# Patient Record
Sex: Female | Born: 1965
Health system: Southern US, Community
[De-identification: ages and names within clinical notes are randomized; demographics above are authoritative.]

## PROBLEM LIST (undated history)

## (undated) DIAGNOSIS — N6002 Solitary cyst of left breast: Secondary | ICD-10-CM

## (undated) DIAGNOSIS — A6000 Herpesviral infection of urogenital system, unspecified: Secondary | ICD-10-CM

## (undated) HISTORY — DX: Solitary cyst of left breast: N60.02

## (undated) HISTORY — PX: OTHER SURGICAL HISTORY: SHX169

## (undated) HISTORY — DX: Herpesviral infection of urogenital system, unspecified: A60.00

---

## 2006-01-17 HISTORY — PX: OTHER SURGICAL HISTORY: SHX169

## 2006-03-03 ENCOUNTER — Ambulatory Visit: Payer: Self-pay | Admitting: Internal Medicine

## 2007-08-17 ENCOUNTER — Ambulatory Visit: Payer: Self-pay | Admitting: Internal Medicine

## 2007-08-17 DIAGNOSIS — R42 Dizziness and giddiness: Secondary | ICD-10-CM | POA: Insufficient documentation

## 2007-11-15 ENCOUNTER — Ambulatory Visit: Payer: Self-pay | Admitting: Internal Medicine

## 2008-06-10 ENCOUNTER — Encounter (INDEPENDENT_AMBULATORY_CARE_PROVIDER_SITE_OTHER): Payer: Self-pay | Admitting: Internal Medicine

## 2008-06-10 ENCOUNTER — Ambulatory Visit: Payer: Self-pay | Admitting: Family Medicine

## 2008-06-10 DIAGNOSIS — R079 Chest pain, unspecified: Secondary | ICD-10-CM | POA: Insufficient documentation

## 2011-01-24 ENCOUNTER — Encounter: Payer: Self-pay | Admitting: Family Medicine

## 2011-01-24 ENCOUNTER — Ambulatory Visit (INDEPENDENT_AMBULATORY_CARE_PROVIDER_SITE_OTHER): Payer: Commercial Indemnity | Admitting: Family Medicine

## 2011-01-24 VITALS — BP 104/74 | HR 96 | Temp 98.6°F | Wt 172.0 lb

## 2011-01-24 DIAGNOSIS — J029 Acute pharyngitis, unspecified: Secondary | ICD-10-CM

## 2011-01-24 LAB — POCT RAPID STREP A (OFFICE): Rapid Strep A Screen: NEGATIVE

## 2011-01-24 MED ORDER — AMOXICILLIN 875 MG PO TABS: 875.0000 mg | ORAL_TABLET | Freq: Two times a day (BID) | ORAL | Status: AC

## 2011-01-24 NOTE — Progress Notes (Signed)
duration of symptoms: 1 day Rhinorrhea: yes congestion:yes ear pain: no sore throat: yes Cough: no Myalgias:yes, diffuse other concerns: no known fever but she feels hot.  Had chills last night and sweats, too.   Taking tylenol and zycam, hot tea.   No known sick contacts  ROS: See HPI.  Otherwise negative.    Meds, vitals, and allergies reviewed.   GEN: nad, alert and oriented HEENT: mucous membranes moist, TM w/o erythema, nasal epithelium injected, OP with cobblestoning NECK: supple w/ tender LA CV: rrr. PULM: ctab, no inc wob ABD: soft, +bs EXT: no edema  RST neg.

## 2011-01-24 NOTE — Patient Instructions (Signed)
Drink plenty of fluids, take tylenol as needed, and gargle with warm salt water for your throat.  This should gradually improve.  Take care.  Let us know if you have other concerns.   Start the amoxil today.  

## 2011-01-25 DIAGNOSIS — J029 Acute pharyngitis, unspecified: Secondary | ICD-10-CM | POA: Insufficient documentation

## 2011-01-25 NOTE — Assessment & Plan Note (Signed)
She has 4/5 characteristics of strep (no exudates).  Presumed strep, treat.  ddx d/w, supportive tx o/w.  She agrees.

## 2011-04-22 ENCOUNTER — Telehealth: Payer: Self-pay

## 2011-05-21 ENCOUNTER — Ambulatory Visit: Payer: Self-pay | Admitting: Orthopedic Surgery

## 2014-06-29 ENCOUNTER — Encounter: Payer: Self-pay | Admitting: *Deleted

## 2014-06-29 DIAGNOSIS — Y998 Other external cause status: Secondary | ICD-10-CM | POA: Diagnosis not present

## 2014-06-29 DIAGNOSIS — W5501XA Bitten by cat, initial encounter: Secondary | ICD-10-CM | POA: Insufficient documentation

## 2014-06-29 DIAGNOSIS — Y9389 Activity, other specified: Secondary | ICD-10-CM | POA: Diagnosis not present

## 2014-06-29 DIAGNOSIS — S61451A Open bite of right hand, initial encounter: Secondary | ICD-10-CM | POA: Diagnosis present

## 2014-06-29 DIAGNOSIS — S61011A Laceration without foreign body of right thumb without damage to nail, initial encounter: Secondary | ICD-10-CM | POA: Insufficient documentation

## 2014-06-29 DIAGNOSIS — Y9289 Other specified places as the place of occurrence of the external cause: Secondary | ICD-10-CM | POA: Diagnosis not present

## 2014-06-29 NOTE — ED Notes (Signed)
Pt has catbite to right hand.   States her 2 cats were fighting and broke up the cat fight.  Pt has puncture wounds to right thumb.

## 2014-06-30 ENCOUNTER — Emergency Department
Admission: EM | Admit: 2014-06-30 | Discharge: 2014-06-30 | Disposition: A | Discharge status: Home / Self Care | Payer: BLUE CROSS/BLUE SHIELD | Attending: Emergency Medicine | Admitting: Emergency Medicine

## 2014-06-30 DIAGNOSIS — W5501XA Bitten by cat, initial encounter: Secondary | ICD-10-CM

## 2014-06-30 DIAGNOSIS — S61451A Open bite of right hand, initial encounter: Secondary | ICD-10-CM

## 2014-06-30 MED ORDER — IBUPROFEN 600 MG PO TABS: 600.0000 mg | ORAL_TABLET | Freq: Three times a day (TID) | ORAL | Status: DC | PRN

## 2014-06-30 MED ORDER — AMOXICILLIN-POT CLAVULANATE 875-125 MG PO TABS
1.0000 | ORAL_TABLET | Freq: Once | ORAL | Status: AC
  Administered 2014-06-30: 1 via ORAL

## 2014-06-30 MED ORDER — BACITRACIN ZINC 500 UNIT/GM EX OINT
TOPICAL_OINTMENT | CUTANEOUS | Status: AC
  Administered 2014-06-30: 1 via TOPICAL
  Filled 2014-06-30: qty 0.9

## 2014-06-30 MED ORDER — IBUPROFEN 600 MG PO TABS
600.0000 mg | ORAL_TABLET | Freq: Once | ORAL | Status: AC
  Administered 2014-06-30: 600 mg via ORAL

## 2014-06-30 MED ORDER — HYDROCODONE-ACETAMINOPHEN 5-325 MG PO TABS: 1.0000 | ORAL_TABLET | Freq: Four times a day (QID) | ORAL | Status: DC | PRN

## 2014-06-30 MED ORDER — AMOXICILLIN-POT CLAVULANATE 875-125 MG PO TABS
ORAL_TABLET | ORAL | Status: AC
Start: 2014-06-30 — End: 2014-06-30
  Administered 2014-06-30: 1 via ORAL
  Filled 2014-06-30: qty 1

## 2014-06-30 MED ORDER — BACITRACIN 500 UNIT/GM EX OINT
1.0000 "application " | TOPICAL_OINTMENT | Freq: Two times a day (BID) | CUTANEOUS | Status: DC
  Administered 2014-06-30: 1 via TOPICAL

## 2014-06-30 MED ORDER — HYDROCODONE-ACETAMINOPHEN 5-325 MG PO TABS
ORAL_TABLET | ORAL | Status: AC
  Administered 2014-06-30: 1 via ORAL
  Filled 2014-06-30: qty 1

## 2014-06-30 MED ORDER — AMOXICILLIN-POT CLAVULANATE 875-125 MG PO TABS: 1.0000 | ORAL_TABLET | Freq: Two times a day (BID) | ORAL | Status: DC

## 2014-06-30 MED ORDER — IBUPROFEN 600 MG PO TABS
ORAL_TABLET | ORAL | Status: AC
Start: 2014-06-30 — End: 2014-06-30
  Administered 2014-06-30: 600 mg via ORAL
  Filled 2014-06-30: qty 1

## 2014-06-30 MED ORDER — HYDROCODONE-ACETAMINOPHEN 5-325 MG PO TABS
1.0000 | ORAL_TABLET | Freq: Once | ORAL | Status: AC
Start: 2014-06-30 — End: 2014-06-30
  Administered 2014-06-30: 1 via ORAL

## 2014-06-30 NOTE — ED Provider Notes (Signed)
[facility] Emergency Department Provider Note  ____________________________________________  Time seen: Approximately 3:30 AM  I have reviewed the triage vital signs and the nursing notes.   HISTORY  Chief Complaint Animal Bite    HPI Mercedes Daniels is a 49 y.o. female who presents with cat bite to right hand. States her 2 cats were fighting and she broke up the fight. Patient is right-hand dominant. Presents with puncture wounds to right thumb base. Complains of 10/10 pain. Denies heavy bleeding, numbness, tingling, foreign body sensation in wound.Both animals' vaccinations are up-to-date.   History reviewed. No pertinent past medical history. Patient is not a diabetic  Patient Active Problem List   Diagnosis Date Noted  . Pharyngitis 01/25/2011  . CHEST PAIN 06/10/2008  . VERTIGO 08/17/2007   Tetanus is up-to-date  Past Surgical History  Procedure Laterality Date  . Cyst under tongue      As a child    No current outpatient prescriptions on file.  Allergies Review of patient's allergies indicates no known allergies.  Family History  Problem Relation Age of Onset  . Cancer Father     Prostate  . Cancer Maternal Grandmother     Breast  . Hypertension Neg Hx   . Heart disease Neg Hx   . Diabetes Neg Hx     Social History History  Substance Use Topics  . Smoking status: Never Smoker   . Smokeless tobacco: Not on file  . Alcohol Use: Yes    Review of Systems Constitutional: No fever/chills Eyes: No visual changes. ENT: No sore throat. Cardiovascular: Denies chest pain. Respiratory: Denies shortness of breath. Gastrointestinal: No abdominal pain.  No nausea, no vomiting.  No diarrhea.  No constipation. Genitourinary: Negative for dysuria. Musculoskeletal: Positive for right hand pain. Negative for back pain. Skin: Negative for rash. Neurological: Negative for headaches, focal weakness or numbness.  10-point ROS otherwise  negative.  ____________________________________________   PHYSICAL EXAM:  VITAL SIGNS: ED Triage Vitals  Enc Vitals Group     BP 06/29/14 2300 129/79 mmHg     Pulse Rate 06/29/14 2258 76     Resp 06/29/14 2258 22     Temp 06/29/14 2258 98.6 F (37 C)     Temp Source 06/29/14 2258 Oral     SpO2 06/29/14 2258 100 %     Weight 06/29/14 2258 145 lb (65.772 kg)     Height 06/29/14 2258  (1.626 m)     Head Cir --      Peak Flow --      Pain Score 06/29/14 2259 8     Pain Loc --      Pain Edu? --      Excl. in GC? --     Constitutional: Alert and oriented. Anxious, in mild acute distress Eyes: Conjunctivae are normal. PERRL. EOMI. Head: Atraumatic. Nose: No congestion/rhinnorhea. Mouth/Throat: Mucous membranes are moist.  Oropharynx non-erythematous. Neck: No stridor.   Cardiovascular: Normal rate, regular rhythm. Grossly normal heart sounds.  Good peripheral circulation. Respiratory: Normal respiratory effort.  No retractions. Lungs CTAB. Gastrointestinal: Soft and nontender. No distention. No abdominal bruits. No CVA tenderness. Musculoskeletal: Right hand: Linear laceration to dorsal aspect of right thumb which is approximately 2 x 0.5 cm without active bleeding or foreign body visualized. Additional tiny puncture marks to dorsal part of right thumb and thenar eminence. 2+ radial pulses. Brisk less than 5 seconds cap refill. No lower extremity tenderness nor edema.  No joint effusions.  Neurologic:  Normal speech and language. No gross focal neurologic deficits are appreciated. Speech is normal. No gait instability. Skin:  Skin is warm, dry and intact. No rash noted. Psychiatric: Mood and affect are normal. Speech and behavior are normal.  ____________________________________________   LABS (all labs ordered are listed, but only abnormal results are displayed)  Labs Reviewed - No data to  display ____________________________________________  EKG  None ____________________________________________  RADIOLOGY  None ____________________________________________   PROCEDURES  Procedure(s) performed: None  Critical Care performed: No  ____________________________________________   INITIAL IMPRESSION / ASSESSMENT AND PLAN / ED COURSE  Pertinent labs & imaging results that were available during my care of the patient were reviewed by me and considered in my medical decision making (see chart for details).  49 year old female who presents with cat bite to right hand. Will irrigate and cleanse wound, administer analgesics, start patient on Augmentin with close orthopedic follow-up. Strict return precautions given. Patient and spouse verbalized understanding and agree with plan of care. ____________________________________________   FINAL CLINICAL IMPRESSION(S) / ED DIAGNOSES  Final diagnoses:  Cat bite of hand, right, initial encounter      Irean Hong, MD 06/30/14 [phone]

## 2014-06-30 NOTE — ED Notes (Signed)
Pt. Has 2cm long by 0.5 cm wide open wound to dorsal part of rt. Thumb.  Pt. Has smaller puncture wounds to inner part of rt. Thumb and hand.  Pt. Has swelling to inside of rt. Hand.

## 2014-06-30 NOTE — Discharge Instructions (Signed)
1. Take antibiotics as prescribed (Augmentin 875 mg twice daily 7 days). 2. Keep wound clean and dry. 3. Return to the ER for worsening symptoms, increased swelling, redness, red streaks up your arm, fever, vomiting or other concerns.  Animal Bite An animal bite can result in a scratch on the skin, deep open cut, puncture of the skin, crush injury, or tearing away of the skin or a body part. Dogs are responsible for most animal bites. Children are bitten more often than adults. An animal bite can range from very mild to more serious. A small bite from your house pet is no cause for alarm. However, some animal bites can become infected or injure a bone or other tissue. You must seek medical care if:  The skin is broken and bleeding does not slow down or stop after 15 minutes.  The puncture is deep and difficult to clean (such as a cat bite).  Pain, warmth, redness, or pus develops around the wound.  The bite is from a stray animal or rodent. There may be a risk of rabies infection.  The bite is from a snake, raccoon, skunk, fox, coyote, or bat. There may be a risk of rabies infection.  The person bitten has a chronic illness such as diabetes, liver disease, or cancer, or the person takes medicine that lowers the immune system.  There is concern about the location and severity of the bite. It is important to clean and protect an animal bite wound right away to prevent infection. Follow these steps:  Clean the wound with plenty of water and soap.  Apply an antibiotic cream.  Apply gentle pressure over the wound with a clean towel or gauze to slow or stop bleeding.  Elevate the affected area above the heart to help stop any bleeding.  Seek medical care. Getting medical care within 8 hours of the animal bite leads to the best possible outcome. DIAGNOSIS  Your caregiver will most likely:  Take a detailed history of the animal and the bite injury.  Perform a wound exam.  Take your  medical history. Blood tests or X-rays may be performed. Sometimes, infected bite wounds are cultured and sent to a lab to identify the infectious bacteria.  TREATMENT  Medical treatment will depend on the location and type of animal bite as well as the patient's medical history. Treatment may include:  Wound care, such as cleaning and flushing the wound with saline solution, bandaging, and elevating the affected area.  Antibiotics.  Tetanus immunization.  Rabies immunization.  Leaving the wound open to heal. This is often done with animal bites, due to the high risk of infection. However, in certain cases, wound closure with stitches, wound adhesive, skin adhesive strips, or staples may be used. Infected bites that are left untreated may require intravenous (IV) antibiotics and surgical treatment in the hospital. HOME CARE INSTRUCTIONS  Follow your caregiver's instructions for wound care.  Take all medicines as directed.  If your caregiver prescribes antibiotics, take them as directed. Finish them even if you start to feel better.  Follow up with your caregiver for further exams or immunizations as directed. You may need a tetanus shot if:  You cannot remember when you had your last tetanus shot.  You have never had a tetanus shot.  The injury broke your skin. If you get a tetanus shot, your arm may swell, get red, and feel warm to the touch. This is common and not a problem. If you need  a tetanus shot and you choose not to have one, there is a rare chance of getting tetanus. Sickness from tetanus can be serious. SEEK MEDICAL CARE IF:  You notice warmth, redness, soreness, swelling, pus discharge, or a bad smell coming from the wound.  You have a red line on the skin coming from the wound.  You have a fever, chills, or a general ill feeling.  You have nausea or vomiting.  You have continued or worsening pain.  You have trouble moving the injured part.  You have other  questions or concerns. MAKE SURE YOU:  Understand these instructions.  Will watch your condition.  Will get help right away if you are not doing well or get worse. Document Released: 09/21/2010 Document Revised: 03/28/2011 Document Reviewed: 09/21/2010 [facility] Patient Information 2015 [place]. This information is not intended to replace advice given to you by your health care provider. Make sure you discuss any questions you have with your health care provider.

## 2015-01-07 LAB — HM MAMMOGRAPHY

## 2015-01-14 ENCOUNTER — Ambulatory Visit (INDEPENDENT_AMBULATORY_CARE_PROVIDER_SITE_OTHER): Payer: BLUE CROSS/BLUE SHIELD | Admitting: Internal Medicine

## 2015-01-14 ENCOUNTER — Encounter: Payer: Self-pay | Admitting: Internal Medicine

## 2015-01-14 VITALS — BP 110/70 | HR 91 | Temp 97.4°F | Wt 144.0 lb

## 2015-01-14 DIAGNOSIS — J029 Acute pharyngitis, unspecified: Secondary | ICD-10-CM

## 2015-01-14 LAB — POCT RAPID STREP A (OFFICE): Rapid Strep A Screen: NEGATIVE

## 2015-01-14 NOTE — Assessment & Plan Note (Addendum)
Seems viral but given absence of cough, will check rapid strep Rapid strep negative Discussed apparent viral etiology and supportive care If worsens with drainage and cough, would try empiric amoxicillin

## 2015-01-14 NOTE — Patient Instructions (Signed)
Call next week if you are getting worse rather than better.

## 2015-01-14 NOTE — Progress Notes (Signed)
   Subjective:    Patient ID: Mercedes Daniels, female    DOB: 18-Apr-1965, 49 y.o.   MRN: [id number]  HPI Here due to sore throat  Started 2 days ago Some drainage down throat Right ear is stopped up at times Tried gargling--?some help Right head is congestion No fever No cough  Tried claritin D-- not clearly helpful  No current outpatient prescriptions on file prior to visit.   No current facility-administered medications on file prior to visit.    No Known Allergies  No past medical history on file.  Past Surgical History  Procedure Laterality Date  . Cyst under tongue      As a child    Family History  Problem Relation Age of Onset  . Cancer Father     Prostate  . Cancer Maternal Grandmother     Breast  . Hypertension Neg Hx   . Heart disease Neg Hx   . Diabetes Neg Hx     Social History   Social History  . Marital Status: Married    Spouse Name: N/A  . Number of Children: 0  . Years of Education: N/A   Occupational History  . HR manager     [employer]   Social History Main Topics  . Smoking status: Never Smoker   . Smokeless tobacco: Not on file  . Alcohol Use: Yes  . Drug Use: Not on file  . Sexual Activity: Not on file   Other Topics Concern  . Not on file   Social History Narrative   Married, 2 step-children   Review of Systems  No rash Slight aching No vomiting or diarrhea Still sees Dr Luella Cookosenow for gyn Gets yearly mammogram    Objective:   Physical Exam  Constitutional: She appears well-developed. No distress.  HENT:  No sinus tenderness TMs normal Mild nasal congestion Pharynx with some injection but no tonsillar enlargement or exudates  Neck: Normal range of motion. Neck supple.  Pulmonary/Chest: Effort normal and breath sounds normal. No respiratory distress. She has no wheezes. She has no rales.  Lymphadenopathy:    She has no cervical adenopathy.  Skin: No rash noted.          Assessment & Plan:

## 2015-01-14 NOTE — Progress Notes (Signed)
Pre visit review using our clinic review tool, if applicable. No additional management support is needed unless otherwise documented below in the visit note. 

## 2015-01-20 ENCOUNTER — Telehealth: Payer: Self-pay

## 2015-01-20 DIAGNOSIS — R059 Cough, unspecified: Secondary | ICD-10-CM

## 2015-01-20 DIAGNOSIS — R05 Cough: Secondary | ICD-10-CM

## 2015-01-20 MED ORDER — BENZONATATE 200 MG PO CAPS: 200.0000 mg | ORAL_CAPSULE | Freq: Three times a day (TID) | ORAL | Status: DC | PRN

## 2015-01-20 MED ORDER — AMOXICILLIN-POT CLAVULANATE 875-125 MG PO TABS
1.0000 | ORAL_TABLET | Freq: Two times a day (BID) | ORAL | Status: DC
Start: 2015-01-20 — End: 2015-05-28

## 2015-01-20 NOTE — Telephone Encounter (Signed)
Augmentin sent into pharmacy for presumed sinusitis. She will take 1 tablet by mouth twice daily for 7 days.

## 2015-01-20 NOTE — Telephone Encounter (Signed)
Called and notified patient of Kate's comments. Patient verbalized understanding.  

## 2015-01-20 NOTE — Telephone Encounter (Signed)
I've sent in tessalon pearls for cough. She may take 1 capsule by mouth three times daily as needed for cough. These are non drowsy, so she may take them during the day.

## 2015-01-20 NOTE — Telephone Encounter (Signed)
Called and notified patient of Kate's comments. Patient stated that she was hoping she could get something as well for her cough.

## 2015-01-20 NOTE — Telephone Encounter (Signed)
Pt left v/m; pt was seen 01/14/15; the head congestion has worsened; when blows nose has green-brown mucus and now has prod cough with green - brown phlegm; pt was to cb if worsened. Pt request abx to [facility]. Per 01/14/15 office note Dr Alphonsus SiasLetvak listed if worsens with drainage and cough would try empiric amoxicillin. Pt request cb. Dr Alphonsus SiasLetvak out of office.Please advise.

## 2015-01-27 ENCOUNTER — Encounter: Payer: Self-pay | Admitting: Internal Medicine

## 2015-05-19 ENCOUNTER — Encounter: Payer: Self-pay | Admitting: *Deleted

## 2015-05-28 ENCOUNTER — Encounter: Payer: Self-pay | Admitting: General Surgery

## 2015-05-28 ENCOUNTER — Ambulatory Visit (INDEPENDENT_AMBULATORY_CARE_PROVIDER_SITE_OTHER): Payer: BLUE CROSS/BLUE SHIELD | Admitting: General Surgery

## 2015-05-28 ENCOUNTER — Ambulatory Visit: Payer: Self-pay

## 2015-05-28 VITALS — BP 112/66 | HR 76 | Resp 12 | Ht 64.0 in | Wt 149.0 lb

## 2015-05-28 DIAGNOSIS — N63 Unspecified lump in breast: Secondary | ICD-10-CM

## 2015-05-28 DIAGNOSIS — N6320 Unspecified lump in the left breast, unspecified quadrant: Secondary | ICD-10-CM

## 2015-05-28 NOTE — Patient Instructions (Addendum)
The patient is aware to call back for any questions or concerns. Patient to return in three months.  

## 2015-05-28 NOTE — Progress Notes (Signed)
Patient ID: Mercedes Daniels, female   DOB: 1965/12/13, 50 y.o.   MRN: [id number]  Chief Complaint  Patient presents with  . Breast Problem    lerft breast mass    HPI Mercedes Daniels is a 50 y.o. female.  who presents for a breast evaluation. The most recent mammogram was done on 01-07-15.  Patient does perform regular self breast checks and gets regular mammograms done.  She states she felt the left breast mass around February. She states there is some tenderness around her cycle. It has not changed in size, "bb" size.    HPI  History reviewed. No pertinent past medical history.  Past Surgical History  Procedure Laterality Date  . Cyst under tongue      As a child  . Breast lift  2008    Family History  Problem Relation Age of Onset  . Cancer Father     Prostate  . Cancer Maternal Grandmother     Breast  . Hypertension Neg Hx   . Heart disease Neg Hx   . Diabetes Neg Hx     Social History Social History  Substance Use Topics  . Smoking status: Never Smoker   . Smokeless tobacco: Never Used  . Alcohol Use: 0.0 oz/week    0 Standard drinks or equivalent per week     Comment: 4/week    No Known Allergies  Current Outpatient Prescriptions  Medication Sig Dispense Refill  . ibuprofen (ADVIL,MOTRIN) 200 MG tablet Take 200 mg by mouth every 6 (six) hours as needed.    . valACYclovir (VALTREX) 500 MG tablet Take 500 mg by mouth every 30 (thirty) days.     No current facility-administered medications for this visit.    Review of Systems Review of Systems  Constitutional: Negative.   Respiratory: Negative.   Cardiovascular: Negative.     Blood pressure 112/66, pulse 76, resp. rate 12, height  (1.626 m), weight 149 lb (67.586 kg), last menstrual period 05/15/2015.  Physical Exam Physical Exam  Constitutional: She is oriented to person, place, and time. She appears well-developed and well-nourished.  HENT:  Mouth/Throat: Oropharynx is clear and moist.   Eyes: Conjunctivae are normal. No scleral icterus.  Neck: Neck supple.  Cardiovascular: Normal rate, regular rhythm and normal heart sounds.   Pulmonary/Chest: Effort normal and breath sounds normal. Right breast exhibits no inverted nipple, no mass, no nipple discharge, no skin change and no tenderness. Left breast exhibits no inverted nipple, no nipple discharge, no skin change and no tenderness.  Pt pointed to left breast uoq where she felt the lump. Exam showed no defined mass but the breast in this area has a nodular surface similar to what she had several yrs ago.  Abdominal: Soft. Normal appearance and bowel sounds are normal. There is no hepatomegaly. There is no tenderness.  Lymphadenopathy:    She has no cervical adenopathy.    She has no axillary adenopathy.  Neurological: She is alert and oriented to person, place, and time.  Skin: Skin is warm and dry.  Psychiatric: Her behavior is normal.    Data Reviewed Notes reviewed. Mammogram report from 12/2014 reported normal [place] of left breast uoq showed 2 tiny hypo to anechoic areas-likely benign finding Assessment    No palpable mass in left breast. Likely with 2 tiny cyst in uoq left breast. No need for intervention.    Plan   Pt advised on findings. Short term follow up recommended and pt is  agreeable.    Patient to return in 3 months   PCP:  Tillman AbideLetvak, Richard I This information has been scribed by Dorathy DaftMarsha Hatch RN, BSN,BC.   MercedesSEEPLAPUTHUR Daniels 06/01/2015, 8:17 AM

## 2015-06-01 ENCOUNTER — Encounter: Payer: Self-pay | Admitting: General Surgery

## 2015-07-13 ENCOUNTER — Ambulatory Visit (INDEPENDENT_AMBULATORY_CARE_PROVIDER_SITE_OTHER): Payer: BLUE CROSS/BLUE SHIELD | Admitting: Family Medicine

## 2015-07-13 ENCOUNTER — Ambulatory Visit
Admission: RE | Admit: 2015-07-13 | Discharge: 2015-07-13 | Disposition: A | Discharge status: Home / Self Care | Payer: BLUE CROSS/BLUE SHIELD | Source: Ambulatory Visit | Attending: Family Medicine | Admitting: Family Medicine

## 2015-07-13 ENCOUNTER — Telehealth: Payer: Self-pay | Admitting: Family Medicine

## 2015-07-13 ENCOUNTER — Encounter: Payer: Self-pay | Admitting: Family Medicine

## 2015-07-13 VITALS — BP 114/70 | HR 66 | Temp 97.4°F | Wt 148.5 lb

## 2015-07-13 DIAGNOSIS — R319 Hematuria, unspecified: Secondary | ICD-10-CM | POA: Diagnosis not present

## 2015-07-13 DIAGNOSIS — M5136 Other intervertebral disc degeneration, lumbar region: Secondary | ICD-10-CM | POA: Insufficient documentation

## 2015-07-13 DIAGNOSIS — R1032 Left lower quadrant pain: Secondary | ICD-10-CM | POA: Diagnosis not present

## 2015-07-13 DIAGNOSIS — R350 Frequency of micturition: Secondary | ICD-10-CM | POA: Diagnosis not present

## 2015-07-13 DIAGNOSIS — R102 Pelvic and perineal pain: Secondary | ICD-10-CM | POA: Insufficient documentation

## 2015-07-13 LAB — POC URINALSYSI DIPSTICK (AUTOMATED)
Bilirubin, UA: NEGATIVE
Glucose, UA: NEGATIVE
Ketones, UA: NEGATIVE
LEUKOCYTES UA: NEGATIVE
NITRITE UA: NEGATIVE
Protein, UA: NEGATIVE
Spec Grav, UA: 1.01
Urobilinogen, UA: 0.2
pH, UA: 7

## 2015-07-13 NOTE — Assessment & Plan Note (Signed)
With microscopic hematuria and urinary frequency. UA pos for RBCs only. ?kidney stones Stat CT of abd/pelvis without contrast for further evaluation. Stop AZO. The patient indicates understanding of these issues and agrees with the plan. Orders Placed This Encounter  Procedures  . CT Abdomen Pelvis Wo Contrast  . POCT Urinalysis Dipstick (Automated)

## 2015-07-13 NOTE — Addendum Note (Signed)
Addended by: Dianne DunARON, Seyon Strader M on: 07/13/2015 12:23 PM   Modules accepted: Orders, SmartSet

## 2015-07-13 NOTE — Patient Instructions (Signed)
It was really nice to meet you. Please stop by to see Revonda StandardAllison on your way out.

## 2015-07-13 NOTE — Telephone Encounter (Signed)
I will let her know as soon as I know.

## 2015-07-13 NOTE — Telephone Encounter (Signed)
[facility] working her in at main hospital instead of out pt imaging. Pt just wants to make sure you get the results today and can let her know as soon as you do.

## 2015-07-13 NOTE — Progress Notes (Signed)
Subjective:   Patient ID: Mercedes Daniels, female    DOB: [birth year], 50 y.o.   MRN: [id number]  Mercedes Daniels is a pleasant 50 y.o. year old female pt of Dr. Alphonsus SiasLetvak, new to me, who presents to clinic today with Urinary Frequency  on 07/13/2015  HPI:  Urinary frequency and suprapubic pressure for several days. Supra pubic pressure is mainly left sided- 2/10. No dysuria. No hematuria. No nausea, vomiting or fevers.  Did have chills yesterday.  Never had anything like this before.  Tried OTC AZO without any improvement of symptoms.  She does not have a h/o kidney stones.  Current Outpatient Prescriptions on File Prior to Visit  Medication Sig Dispense Refill  . ibuprofen (ADVIL,MOTRIN) 200 MG tablet Take 200 mg by mouth every 6 (six) hours as needed.    . valACYclovir (VALTREX) 500 MG tablet Take 500 mg by mouth every 30 (thirty) days.     No current facility-administered medications on file prior to visit.    No Known Allergies  No past medical history on file.  Past Surgical History  Procedure Laterality Date  . Cyst under tongue      As a child  . Breast lift  2008    Family History  Problem Relation Age of Onset  . Cancer Father     Prostate  . Cancer Maternal Grandmother     Breast  . Hypertension Neg Hx   . Heart disease Neg Hx   . Diabetes Neg Hx     Social History   Social History  . Marital Status: Married    Spouse Name: N/A  . Number of Children: 0  . Years of Education: N/A   Occupational History  . HR manager     [employer]   Social History Main Topics  . Smoking status: Never Smoker   . Smokeless tobacco: Never Used  . Alcohol Use: 0.0 oz/week    0 Standard drinks or equivalent per week     Comment: 4/week  . Drug Use: No  . Sexual Activity: Not on file   Other Topics Concern  . Not on file   Social History Narrative   Married, 2 step-children   The PMH, PSH, Social History, Family History, Medications, and allergies  [facility], and have been updated if relevant.   Review of Systems  Constitutional: Positive for chills. Negative for fever.  Gastrointestinal: Negative for nausea and vomiting.       +suprapubic pressure  Genitourinary: Negative for dysuria and flank pain.  Musculoskeletal: Negative.   Skin: Negative.   Neurological: Negative.   Hematological: Negative.   All other systems reviewed and are negative.      Objective:    BP 114/70 mmHg  Pulse 66  Temp(Src) 97.4 F (36.3 C) (Oral)  Wt 148 lb 8 oz (67.359 kg)  SpO2 99%  LMP 07/08/2015   Physical Exam  Constitutional: She is oriented to person, place, and time. She appears well-developed and well-nourished. No distress.  HENT:  Head: Normocephalic.  Eyes: Conjunctivae are normal.  Cardiovascular: Normal rate.   Pulmonary/Chest: Effort normal.  Abdominal: Soft. Bowel sounds are normal. She exhibits no distension and no mass. There is no tenderness. There is no rebound and no guarding.  No CVA tenderness  Musculoskeletal: Normal range of motion.  Neurological: She is alert and oriented to person, place, and time. No cranial nerve deficit.  Skin: Skin is warm and dry. She  is not diaphoretic.  Psychiatric: She has a normal mood and affect. Her behavior is normal. Judgment and thought content normal.  Nursing note and vitals reviewed.         Assessment & Plan:   Urinary frequency - Plan: POCT Urinalysis Dipstick (Automated)  Hematuria  Suprapubic pain, acute, left No Follow-up on file.

## 2015-07-13 NOTE — Progress Notes (Signed)
Pre visit review using our clinic review tool, if applicable. No additional management support is needed unless otherwise documented below in the visit note. 

## 2015-07-14 ENCOUNTER — Telehealth: Payer: Self-pay

## 2015-07-14 DIAGNOSIS — R319 Hematuria, unspecified: Secondary | ICD-10-CM | POA: Diagnosis not present

## 2015-07-14 DIAGNOSIS — R350 Frequency of micturition: Secondary | ICD-10-CM | POA: Diagnosis not present

## 2015-07-14 DIAGNOSIS — R1032 Left lower quadrant pain: Secondary | ICD-10-CM | POA: Diagnosis not present

## 2015-07-14 MED ORDER — CIPROFLOXACIN HCL 500 MG PO TABS: 500.0000 mg | ORAL_TABLET | Freq: Two times a day (BID) | ORAL | Status: DC

## 2015-07-14 NOTE — Telephone Encounter (Signed)
Just FYI-this UCx was not ordered or sent yesterday. If I hadn't found this note, it wouldn't have been sent at all. Her office note didn't mention a UCx. Luckily, we still had the urine in the fridge from yesterday that had been prepared for UCx,. I was about to throw it out, when I saw this. Results should be in by Friday.

## 2015-07-14 NOTE — Addendum Note (Signed)
Addended by: Josph MachoANCE, KIMBERLY A on: 07/14/2015 04:34 PM   Modules accepted: Orders

## 2015-07-14 NOTE — Addendum Note (Signed)
Addended by: Josph MachoANCE, Damien Batty A on: 07/14/2015 04:32 PM   Modules accepted: Orders

## 2015-07-14 NOTE — Addendum Note (Signed)
Addended by: Josph MachoANCE, Labrea Eccleston A on: 07/14/2015 05:12 PM   Modules accepted: Orders

## 2015-07-14 NOTE — Telephone Encounter (Signed)
Returned pt's call.  Still having pressure.  Awaiting urine cx results.  Will cover with 3 day course of cipro- eRx sent for cipro 500 mg twice daily x 3 days. The patient indicates understanding of these issues and agrees with the plan.

## 2015-07-14 NOTE — Telephone Encounter (Signed)
Pt left v/m; pt spoke with Dr Dayton MartesAron on 07/13/15; pt was to cb today with update; pt is still feeling some pressure and slight discomfort; not as severe as was but not at her normal. Pt request cb.

## 2015-07-15 NOTE — Telephone Encounter (Signed)
Pt called checking to see if her urine culture results have come back Best number [phone]

## 2015-07-16 LAB — URINE CULTURE: Colony Count: 60000

## 2015-07-16 NOTE — Telephone Encounter (Signed)
See result note.  

## 2015-07-16 NOTE — Telephone Encounter (Signed)
See results note. 

## 2015-07-22 ENCOUNTER — Ambulatory Visit: Payer: BLUE CROSS/BLUE SHIELD

## 2015-08-13 ENCOUNTER — Encounter: Payer: Self-pay | Admitting: *Deleted

## 2015-09-23 ENCOUNTER — Encounter: Payer: Self-pay | Admitting: *Deleted

## 2015-09-29 ENCOUNTER — Ambulatory Visit: Payer: BLUE CROSS/BLUE SHIELD | Admitting: General Surgery

## 2015-10-07 ENCOUNTER — Encounter: Payer: Self-pay | Admitting: *Deleted

## 2015-10-14 ENCOUNTER — Encounter: Payer: Self-pay | Admitting: General Surgery

## 2015-10-14 ENCOUNTER — Ambulatory Visit (INDEPENDENT_AMBULATORY_CARE_PROVIDER_SITE_OTHER): Payer: BLUE CROSS/BLUE SHIELD | Admitting: General Surgery

## 2015-10-14 VITALS — BP 112/62 | HR 60 | Resp 12 | Ht 64.0 in | Wt 144.0 lb

## 2015-10-14 DIAGNOSIS — N63 Unspecified lump in breast: Secondary | ICD-10-CM

## 2015-10-14 DIAGNOSIS — N6320 Unspecified lump in the left breast, unspecified quadrant: Secondary | ICD-10-CM

## 2015-10-14 NOTE — Patient Instructions (Addendum)
The patient is aware to call back for any questions or concerns.    The patient will be asked to return to the office in 3 months with a bilateral screening mammogram.

## 2015-10-14 NOTE — Progress Notes (Signed)
Patient ID: Mercedes FossaSuzanne E Daniels, female   DOB: 1965-08-27, 50 y.o.   MRN: [id number]  Chief Complaint  Patient presents with  . Follow-up    HPI Mercedes FossaSuzanne E Daniels is a [age] y.o. female follow up breast mass. She states she does not think she can feel the lump in left breast anymore. Denies pain.  I have reviewed the history of present illness with the patient.  HPI  History reviewed. No pertinent past medical history.  Past Surgical History:  Procedure Laterality Date  . breast lift  2008  . Cyst under tongue     As a child    Family History  Problem Relation Age of Onset  . Cancer Father     Prostate  . Cancer Maternal Grandmother     Breast  . Hypertension Neg Hx   . Heart disease Neg Hx   . Diabetes Neg Hx     Social History Social History  Substance Use Topics  . Smoking status: Never Smoker  . Smokeless tobacco: Never Used  . Alcohol use 0.0 oz/week     Comment: 4/week    No Known Allergies  Current Outpatient Prescriptions  Medication Sig Dispense Refill  . ibuprofen (ADVIL,MOTRIN) 200 MG tablet Take 200 mg by mouth every 6 (six) hours as needed.    . valACYclovir (VALTREX) 500 MG tablet Take 500 mg by mouth every 30 (thirty) days.     No current facility-administered medications for this visit.     Review of Systems Review of Systems  Constitutional: Negative.   Respiratory: Negative.   Cardiovascular: Negative.     Blood pressure 112/62, pulse 60, resp. rate 12, height 5\' 4"  (1.626 m), weight 144 lb (65.3 kg), last menstrual period 09/24/2015.  Physical Exam Physical Exam  Constitutional: She is oriented to person, place, and time. She appears well-developed and well-nourished.  Eyes: Conjunctivae are normal. No scleral icterus.  Neck: Neck supple.  Pulmonary/Chest: Right breast exhibits no inverted nipple, no mass, no nipple discharge, no skin change and no tenderness. Left breast exhibits no inverted nipple, no mass, no nipple discharge, no skin  change and no tenderness.  Lymphadenopathy:    She has no axillary adenopathy.  Neurological: She is alert and oriented to person, place, and time.  Skin: Skin is warm and dry.  Psychiatric: Her behavior is normal.    Data Reviewed Notes reviewed. Mammogram report from 12/2014 reported normal  Assessment    Mass that was felt previously in upper outer quadrant of left breast is no longer palpable at this time. Prior ultrasound showed 2 tiny cysts in this area. Patient reassured this is a benign finding, requiring no further imaging or intervention.    Plan    The patient will be asked to return to the office in 3 months with her annual bilateral screening mammogram.       Ziggy Chanthavong G 10/14/2015, 10:55 AM

## 2015-12-16 DIAGNOSIS — Z23 Encounter for immunization: Secondary | ICD-10-CM | POA: Diagnosis not present

## 2015-12-22 DIAGNOSIS — D225 Melanocytic nevi of trunk: Secondary | ICD-10-CM | POA: Diagnosis not present

## 2016-01-12 DIAGNOSIS — Z1231 Encounter for screening mammogram for malignant neoplasm of breast: Secondary | ICD-10-CM | POA: Diagnosis not present

## 2016-01-13 ENCOUNTER — Encounter: Payer: Self-pay | Admitting: General Surgery

## 2016-01-20 ENCOUNTER — Encounter: Payer: Self-pay | Admitting: General Surgery

## 2016-01-20 ENCOUNTER — Ambulatory Visit (INDEPENDENT_AMBULATORY_CARE_PROVIDER_SITE_OTHER): Payer: BLUE CROSS/BLUE SHIELD | Admitting: General Surgery

## 2016-01-20 VITALS — BP 132/76 | HR 74 | Ht 64.0 in | Wt 151.0 lb

## 2016-01-20 DIAGNOSIS — N6019 Diffuse cystic mastopathy of unspecified breast: Secondary | ICD-10-CM

## 2016-01-20 DIAGNOSIS — Z803 Family history of malignant neoplasm of breast: Secondary | ICD-10-CM

## 2016-01-20 NOTE — Progress Notes (Signed)
Patient ID: Mercedes Daniels, female   DOB: [birth year], 51 y.o.   MRN: [id number]  Chief Complaint  Patient presents with  . Follow-up    mammogram    HPI Mercedes FossaSuzanne E Daniels is a 51 y.o. female who presents for a breast evaluation. The most recent mammogram was done on 01/12/2016. Patient has had prior cysts. Patient does perform regular self breast checks and gets regular mammograms done.   I have reviewed the history of present illness with the patient.  HPI  No past medical history on file.  Past Surgical History:  Procedure Laterality Date  . breast lift  2008  . Cyst under tongue     As a child    Family History  Problem Relation Age of Onset  . Cancer Father     Prostate  . Cancer Maternal Grandmother     Breast  . Hypertension Neg Hx   . Heart disease Neg Hx   . Diabetes Neg Hx     Social History Social History  Substance Use Topics  . Smoking status: Never Smoker  . Smokeless tobacco: Never Used  . Alcohol use 0.0 oz/week     Comment: 4/week    No Known Allergies  Current Outpatient Prescriptions  Medication Sig Dispense Refill  . ibuprofen (ADVIL,MOTRIN) 200 MG tablet Take 200 mg by mouth every 6 (six) hours as needed.    . valACYclovir (VALTREX) 500 MG tablet Take 500 mg by mouth every 30 (thirty) days.     No current facility-administered medications for this visit.     Review of Systems Review of Systems  Constitutional: Negative.   Respiratory: Negative.   Cardiovascular: Negative.     Blood pressure 132/76, pulse 74, height 5\' 4"  (1.626 m), weight 151 lb (68.5 kg).  Physical Exam Physical Exam  Constitutional: She is oriented to person, place, and time. She appears well-developed and well-nourished.  Eyes: Conjunctivae are normal. No scleral icterus.  Neck: Neck supple.  Cardiovascular: Normal rate, regular rhythm and normal heart sounds.   Pulmonary/Chest: Effort normal and breath sounds normal. Right breast exhibits no inverted nipple,  no mass, no nipple discharge, no skin change and no tenderness. Left breast exhibits no inverted nipple, no mass, no nipple discharge, no skin change and no tenderness.  Abdominal: Soft. Normal appearance and bowel sounds are normal. There is no hepatomegaly. There is no tenderness. No hernia.  Lymphadenopathy:    She has no cervical adenopathy.    She has no axillary adenopathy.  Neurological: She is alert and oriented to person, place, and time.  Skin: Skin is warm and dry.    Data Reviewed Mammogram reviewed   Assessment    FCD, stable exam. Remote FH of breast cancer Plan    Patient will be asked to return to the office in one year with a bilateral screening mammogram.     This information has been scribed by Ples SpecterJessica Qualls CMA.  Sherell Christoffel G 01/20/2016, 10:21 AM

## 2016-01-20 NOTE — Patient Instructions (Addendum)
Patient will be asked to return to the office in one year with a bilateral screening mammogram. With DrMarland Kitchen.. Lemar LivingsByrnett. In the interval she is asked to call for any new problems with her breast. She is also aware she will need a screening colonoscopy in 1 yr

## 2016-02-22 DIAGNOSIS — A6000 Herpesviral infection of urogenital system, unspecified: Secondary | ICD-10-CM | POA: Diagnosis not present

## 2016-02-22 DIAGNOSIS — Z01419 Encounter for gynecological examination (general) (routine) without abnormal findings: Secondary | ICD-10-CM | POA: Diagnosis not present

## 2016-02-22 DIAGNOSIS — Z0000 Encounter for general adult medical examination without abnormal findings: Secondary | ICD-10-CM | POA: Diagnosis not present

## 2016-02-22 DIAGNOSIS — Z1321 Encounter for screening for nutritional disorder: Secondary | ICD-10-CM | POA: Diagnosis not present

## 2016-02-26 DIAGNOSIS — A6000 Herpesviral infection of urogenital system, unspecified: Secondary | ICD-10-CM | POA: Diagnosis not present

## 2016-02-26 DIAGNOSIS — Z1321 Encounter for screening for nutritional disorder: Secondary | ICD-10-CM | POA: Diagnosis not present

## 2016-02-26 DIAGNOSIS — Z0000 Encounter for general adult medical examination without abnormal findings: Secondary | ICD-10-CM | POA: Diagnosis not present

## 2016-02-26 DIAGNOSIS — Z01419 Encounter for gynecological examination (general) (routine) without abnormal findings: Secondary | ICD-10-CM | POA: Diagnosis not present

## 2016-02-29 DIAGNOSIS — L239 Allergic contact dermatitis, unspecified cause: Secondary | ICD-10-CM | POA: Diagnosis not present

## 2016-12-22 DIAGNOSIS — D225 Melanocytic nevi of trunk: Secondary | ICD-10-CM | POA: Diagnosis not present

## 2017-01-04 ENCOUNTER — Other Ambulatory Visit: Payer: Self-pay

## 2017-01-04 MED ORDER — VALACYCLOVIR HCL 500 MG PO TABS: 500.0000 mg | ORAL_TABLET | ORAL | 0 refills | Status: DC

## 2017-01-26 ENCOUNTER — Ambulatory Visit: Payer: BLUE CROSS/BLUE SHIELD | Admitting: General Surgery

## 2017-01-27 DIAGNOSIS — Z1231 Encounter for screening mammogram for malignant neoplasm of breast: Secondary | ICD-10-CM | POA: Diagnosis not present

## 2017-01-31 ENCOUNTER — Encounter: Payer: Self-pay | Admitting: General Surgery

## 2017-02-02 ENCOUNTER — Encounter: Payer: Self-pay | Admitting: General Surgery

## 2017-02-02 ENCOUNTER — Ambulatory Visit: Payer: BLUE CROSS/BLUE SHIELD | Admitting: General Surgery

## 2017-02-02 VITALS — BP 112/58 | HR 88 | Resp 12 | Ht 64.0 in | Wt 132.0 lb

## 2017-02-02 DIAGNOSIS — Z803 Family history of malignant neoplasm of breast: Secondary | ICD-10-CM | POA: Diagnosis not present

## 2017-02-02 DIAGNOSIS — N6012 Diffuse cystic mastopathy of left breast: Secondary | ICD-10-CM | POA: Diagnosis not present

## 2017-02-02 NOTE — Progress Notes (Signed)
Patient ID: Mercedes Daniels, female   DOB: [birth year], 52 y.o.   MRN: [id number]  Chief Complaint  Patient presents with  . Follow-up    HPI Mercedes FossaSuzanne E Vaccarella is a 52 y.o. female.  who presents for a breast evaluation. The most recent mammogram was done on 01-27-17.  Patient does perform regular self breast checks and gets regular mammograms done.   She has noticed some left breast tenderness and she is currently menstruating. She works in [employer].  HPI  Past Medical History:  Diagnosis Date  . Patient denies medical problems     Past Surgical History:  Procedure Laterality Date  . breast lift  2008   [place]  . Cyst under tongue     As a child    Family History  Problem Relation Age of Onset  . Cancer Father        Prostate  . Cancer Maternal Grandmother        Breast  . Hypertension Neg Hx   . Heart disease Neg Hx   . Diabetes Neg Hx     Social History Social History   Tobacco Use  . Smoking status: Never Smoker  . Smokeless tobacco: Never Used  Substance Use Topics  . Alcohol use: Yes    Alcohol/week: 0.0 oz    Comment: 4/week  . Drug use: No    No Known Allergies  Current Outpatient Medications  Medication Sig Dispense Refill  . ibuprofen (ADVIL,MOTRIN) 200 MG tablet Take 200 mg by mouth every 6 (six) hours as needed.    . valACYclovir (VALTREX) 500 MG tablet Take 1 tablet (500 mg total) by mouth every 30 (thirty) days. (Patient taking differently: Take 500 mg by mouth as needed. ) 90 tablet 0   No current facility-administered medications for this visit.     Review of Systems Review of Systems  Constitutional: Negative.   Respiratory: Negative.   Cardiovascular: Negative.     Blood pressure (!) 112/58, pulse 88, resp. rate 12, height 5\' 4"  (1.626 m), weight 132 lb (59.9 kg), last menstrual period 01/30/2017.  Physical Exam Physical Exam  Constitutional: She is oriented to person, place, and time. She appears well-developed and  well-nourished.  HENT:  Mouth/Throat: Oropharynx is clear and moist.  Eyes: Conjunctivae are normal. No scleral icterus.  Neck: Neck supple.  Cardiovascular: Normal rate, regular rhythm and normal heart sounds.  Pulmonary/Chest: Effort normal and breath sounds normal. Right breast exhibits no inverted nipple, no mass, no nipple discharge, no skin change and no tenderness. Left breast exhibits no inverted nipple, no mass, no nipple discharge, no skin change and no tenderness.  Left breast > right breast.  Lymphadenopathy:    She has no cervical adenopathy.    She has no axillary adenopathy.  Neurological: She is alert and oriented to person, place, and time.  Skin: Skin is warm and dry.  Psychiatric: Her behavior is normal.    Data Reviewed [facility] were reviewed. Scattered fibroglandular density.  No areas of concern.  Stable breast asymmetries.  BI-RADS-2.  Assessment    Benign breast exam.    Plan    The patient questioned whether 3D versus 2D mammograms were preferred.  She has moderate breast density and either is appropriate.  Mainly based on insurance coverage.  Careful self-examination is warranted based on her modest breast volume and petite body habitus.  Annual screening mammograms with her GYN  provider is reasonable.  She is welcome to return at any time should she appreciate any change on her clinical exam or if the mammographic abnormality becomes evident.    Follow up as needed. 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 Continue to do self breast exams. Call for any new breast issues or concerns.   HPI, Physical Exam, Assessment and Plan have been scribed under the direction and in the presence of Earline Mayotte, MD. Dorathy Daft, RN  I have completed the exam and reviewed the above documentation for accuracy and completeness.  I agree with the above.  Museum/gallery conservator has been used and any errors  in dictation or transcription are unintentional.  Donnalee Curry, M.D., F.A.C.S.  Merrily Pew Rondell Frick 02/02/2017, 7:29 PM

## 2017-02-02 NOTE — Patient Instructions (Addendum)
  The patient is aware to call back for any questions or new concerns. Continue to do self breast exams. Call for any new breast issues or concerns. 

## 2017-02-03 DIAGNOSIS — Z23 Encounter for immunization: Secondary | ICD-10-CM | POA: Diagnosis not present

## 2017-02-22 ENCOUNTER — Ambulatory Visit: Payer: BLUE CROSS/BLUE SHIELD | Admitting: Obstetrics and Gynecology

## 2017-02-27 ENCOUNTER — Ambulatory Visit: Payer: BLUE CROSS/BLUE SHIELD | Admitting: Obstetrics and Gynecology

## 2017-02-27 ENCOUNTER — Encounter: Payer: Self-pay | Admitting: Obstetrics and Gynecology

## 2017-02-27 ENCOUNTER — Other Ambulatory Visit: Payer: Self-pay

## 2017-02-27 ENCOUNTER — Encounter: Payer: Self-pay | Admitting: Family Medicine

## 2017-02-27 ENCOUNTER — Ambulatory Visit: Payer: BLUE CROSS/BLUE SHIELD | Admitting: Family Medicine

## 2017-02-27 VITALS — BP 100/70 | HR 78 | Temp 98.2°F | Ht 64.0 in | Wt 134.8 lb

## 2017-02-27 DIAGNOSIS — J209 Acute bronchitis, unspecified: Secondary | ICD-10-CM | POA: Diagnosis not present

## 2017-02-27 MED ORDER — BENZONATATE 200 MG PO CAPS: 200.0000 mg | ORAL_CAPSULE | Freq: Three times a day (TID) | ORAL | 1 refills | Status: DC | PRN

## 2017-02-27 MED ORDER — DOXYCYCLINE HYCLATE 100 MG PO TABS: 100.0000 mg | ORAL_TABLET | Freq: Two times a day (BID) | ORAL | 0 refills | Status: AC

## 2017-02-27 NOTE — Progress Notes (Signed)
Dr. Karleen Hampshire T. Sondos Wolfman, MD, CAQ Sports Medicine [facility] [address] [place], [address] Phone: [phone] Fax: [phone]  02/27/2017  Patient: Mercedes Daniels, MRN: [id number], DOB: 01-07-1966, 52 y.o.  Primary Physician:  Karie Schwalbe, MD   Chief Complaint  Patient presents with  . Cough    with brown/green phelgm  . Sore Throat  . Generalized Body Aches    very mild  . Nasal Congestion   Subjective:   Mercedes Daniels is a 52 y.o. very pleasant female patient who presents with the following:  No fever. Cough is the worst and getting up things in the morning. Never been a smoker.  Pleasant lady who is generally completely healthy who presents now with some worsening cough productive of brown and green sputum as well as some sore throat as well as some mild arthralgias and nasal congestion.  This is been ongoing for about 4 days or so worsening over time.  She is taking NyQuil and DayQuil.  Past Medical History, Surgical History, Social History, Family History, Problem List, Medications, and Allergies have been reviewed and updated if relevant.  Patient Active Problem List   Diagnosis Date Noted  . Fibrocystic disease of left breast 02/02/2017  . Urinary frequency 07/13/2015  . VERTIGO 08/17/2007    Past Medical History:  Diagnosis Date  . Breast mass   . Genital herpes   . Patient denies medical problems     Past Surgical History:  Procedure Laterality Date  . breast lift  2008   [place]  . Cyst under tongue     As a child    Social History   Socioeconomic History  . Marital status: Married    Spouse name: Not on file  . Number of children: 0  . Years of education: Not on file  . Highest education level: Not on file  Social Needs  . Financial resource strain: Not on file  . Food insecurity - worry: Not on file  . Food insecurity - inability: Not on file  . Transportation needs - medical: Not on file  .  Transportation needs - non-medical: Not on file  Occupational History  . Occupation: HR Production designer, theatre/television/film    Comment: Nurse, mental health  Tobacco Use  . Smoking status: Never Smoker  . Smokeless tobacco: Never Used  Substance and Sexual Activity  . Alcohol use: Yes    Alcohol/week: 0.0 oz    Comment: 4/week  . Drug use: No  . Sexual activity: Not on file  Other Topics Concern  . Not on file  Social History Narrative   Married, 2 step-children    Family History  Problem Relation Age of Onset  . Cancer Father        Prostate  . Cancer Maternal Grandmother        Breast  . Hypertension Neg Hx   . Heart disease Neg Hx   . Diabetes Neg Hx     No Known Allergies  Medication list reviewed and updated in full in [facility].  ROS: GEN: Acute illness details above GI: Tolerating PO intake GU: maintaining adequate hydration and urination Pulm: No SOB Interactive and getting along well at home.  Otherwise, ROS is as per the HPI.  Objective:   BP 100/70   Pulse 78   Temp 98.2 F (36.8 C) (Oral)   Ht 5\' 4"  (1.626 m)   Wt 134 lb 12 oz (61.1 kg)  LMP 01/30/2017   SpO2 98%   BMI 23.13 kg/m    GEN: A and O x 3. WDWN. NAD.    ENT: Nose clear, ext NML.  No LAD.  No JVD.  TM's clear. Oropharynx clear.  PULM: Normal WOB, no distress. No crackles, wheezes, rhonchi. CV: RRR, no M/G/R, No rubs, No JVD.   EXT: warm and well-perfused, No c/c/e. PSYCH: Pleasant and conversant.    Laboratory and Imaging Data:  Assessment and Plan:   Acute bronchitis, unspecified organism  Acute bronchitis: discussed plan of care. Given length of symptoms and overall history, will treat with ABX in this case. Continue with additional supportive care, cough medications, liquids, sleep, steam / vaporizer.   Follow-up: No Follow-up on file.  Meds ordered this encounter  Medications  . doxycycline (VIBRA-TABS) 100 MG tablet    Sig: Take 1 tablet (100 mg total) by mouth 2 (two) times daily for 10  days.    Dispense:  20 tablet    Refill:  0  . benzonatate (TESSALON) 200 MG capsule    Sig: Take 1 capsule (200 mg total) by mouth 3 (three) times daily as needed for cough.    Dispense:  30 capsule    Refill:  1   Signed,  Ebbie Cherry T. Kristine Chahal, MD   Allergies as of 02/27/2017   No Known Allergies     Medication List        Accurate as of 02/27/17 10:15 AM. Always use your most recent med list.          benzonatate 200 MG capsule Commonly known as:  TESSALON Take 1 capsule (200 mg total) by mouth 3 (three) times daily as needed for cough.   doxycycline 100 MG tablet Commonly known as:  VIBRA-TABS Take 1 tablet (100 mg total) by mouth 2 (two) times daily for 10 days.   ibuprofen 200 MG tablet Commonly known as:  ADVIL,MOTRIN Take 200 mg by mouth every 6 (six) hours as needed.   valACYclovir 500 MG tablet Commonly known as:  VALTREX Take 1 tablet (500 mg total) by mouth every 30 (thirty) days.

## 2017-03-13 ENCOUNTER — Encounter: Payer: Self-pay | Admitting: Obstetrics and Gynecology

## 2017-03-13 ENCOUNTER — Ambulatory Visit (INDEPENDENT_AMBULATORY_CARE_PROVIDER_SITE_OTHER): Payer: BLUE CROSS/BLUE SHIELD | Admitting: Obstetrics and Gynecology

## 2017-03-13 VITALS — BP 120/64 | HR 66 | Ht 64.0 in | Wt 138.0 lb

## 2017-03-13 DIAGNOSIS — Z1211 Encounter for screening for malignant neoplasm of colon: Secondary | ICD-10-CM | POA: Diagnosis not present

## 2017-03-13 DIAGNOSIS — Z1239 Encounter for other screening for malignant neoplasm of breast: Secondary | ICD-10-CM

## 2017-03-13 DIAGNOSIS — A6004 Herpesviral vulvovaginitis: Secondary | ICD-10-CM | POA: Diagnosis not present

## 2017-03-13 DIAGNOSIS — A6000 Herpesviral infection of urogenital system, unspecified: Secondary | ICD-10-CM | POA: Insufficient documentation

## 2017-03-13 DIAGNOSIS — Z01419 Encounter for gynecological examination (general) (routine) without abnormal findings: Secondary | ICD-10-CM

## 2017-03-13 DIAGNOSIS — Z1231 Encounter for screening mammogram for malignant neoplasm of breast: Secondary | ICD-10-CM

## 2017-03-13 LAB — HEMOCCULT GUIAC POC 1CARD (OFFICE): Fecal Occult Blood, POC: NEGATIVE

## 2017-03-13 NOTE — Patient Instructions (Signed)
I value your feedback and entrusting us with your care. If you get a [facility] patient survey, I would appreciate you taking the time to let us know about your experience today. Thank you! 

## 2017-03-13 NOTE — Progress Notes (Signed)
PCP: Karie SchwalbeLetvak, Richard I, MD   Chief Complaint  Patient presents with  . Annual Exam    HPI:      Ms. Tilden FossaSuzanne E Daniels is a 52 y.o. G0P0000 who LMP was Patient's last menstrual period was 02/26/2017 (exact date)., presents today for her annual examination.  Her menses are regular every 28-30 days, lasting 3 days.  Dysmenorrhea none. She does not have intermenstrual bleeding. She does not have vasomotor sx.   Sex activity: single partner, contraception - vasectomy. She does not have vaginal dryness.  Last Pap: January 07, 2015  Results were: no abnormalities /neg HPV DNA.  Hx of STDs: HSV, takes valtrex prn sx. Doesn't need Rf currently.   Last mammogram: January 27, 2017  Results were: normal--routine follow-up in 12 months, followed by Dr. Lemar LivingsByrnett but now released to us. S/p stable LT breast cysts. There is a FH of breast cancer in her MGM, genetic testing not indicated. There is no FH of ovarian cancer. The patient does do self-breast exams.  Colonoscopy: never; pt interested in ref.   Tobacco use: The patient denies current or previous tobacco use. Alcohol use: social drinker Exercise: very active  She does get adequate calcium and Vitamin D in her diet.  Labs last yr WNL ,except slightly elevated lipids.    Past Medical History:  Diagnosis Date  . Breast cyst, left   . Genital herpes     Past Surgical History:  Procedure Laterality Date  . breast lift  2008   [place]  . Cyst under tongue     As a child    Family History  Problem Relation Age of Onset  . Cancer Father        Prostate  . Breast cancer Maternal Grandmother 1070  . Hypertension Neg Hx   . Heart disease Neg Hx   . Diabetes Neg Hx     Social History   Socioeconomic History  . Marital status: Married    Spouse name: Not on file  . Number of children: 0  . Years of education: Not on file  . Highest education level: Not on file  Social Needs  . Financial resource strain: Not on file  . Food  insecurity - worry: Not on file  . Food insecurity - inability: Not on file  . Transportation needs - medical: Not on file  . Transportation needs - non-medical: Not on file  Occupational History  . Occupation: HR Production designer, theatre/television/filmmanager    Comment: Nurse, [employer]  Tobacco Use  . Smoking status: Never Smoker  . Smokeless tobacco: Never Used  Substance and Sexual Activity  . Alcohol use: Yes    Alcohol/week: 0.0 oz    Comment: 4/week  . Drug use: No  . Sexual activity: Yes    Birth control/protection: None, Surgical    Comment: vasectomy   Other Topics Concern  . Not on file  Social History Narrative   Married, 2 step-children    Current Meds  Medication Sig  . ibuprofen (ADVIL,MOTRIN) 200 MG tablet Take 200 mg by mouth every 6 (six) hours as needed.  . valACYclovir (VALTREX) 500 MG tablet Take 1 tablet (500 mg total) by mouth every 30 (thirty) days. (Patient taking differently: Take 500 mg by mouth as needed. )      ROS:  Review of Systems  Constitutional: Negative for fatigue, fever and unexpected weight change.  Respiratory: Negative for cough, shortness of breath and wheezing.   Cardiovascular: Negative for chest  pain, palpitations and leg swelling.  Gastrointestinal: Negative for blood in stool, constipation, diarrhea, nausea and vomiting.  Endocrine: Negative for cold intolerance, heat intolerance and polyuria.  Genitourinary: Negative for dyspareunia, dysuria, flank pain, frequency, genital sores, hematuria, menstrual problem, pelvic pain, urgency, vaginal bleeding, vaginal discharge and vaginal pain.  Musculoskeletal: Negative for back pain, joint swelling and myalgias.  Skin: Negative for rash.  Neurological: Negative for dizziness, syncope, light-headedness, numbness and headaches.  Hematological: Negative for adenopathy.  Psychiatric/Behavioral: Negative for agitation, confusion, sleep disturbance and suicidal ideas. The patient is not nervous/anxious.      Objective: BP  120/64   Pulse 66   Ht 5\' 4"  (1.626 m)   Wt 138 lb (62.6 kg)   LMP 02/26/2017 (Exact Date)   BMI 23.69 kg/m    Physical Exam  Constitutional: She is oriented to person, place, and time. She appears well-developed and well-nourished.  Genitourinary: Rectum normal, vagina normal and uterus normal. There is no rash or tenderness on the right labia. There is no rash or tenderness on the left labia. No erythema or tenderness in the vagina. No vaginal discharge found. Right adnexum does not display mass and does not display tenderness. Left adnexum does not display mass and does not display tenderness. Cervix does not exhibit motion tenderness or polyp. Uterus is not enlarged or tender. Rectal exam shows no fissure, no mass and guaiac negative stool.  Neck: Normal range of motion. No thyromegaly present.  Cardiovascular: Normal rate, regular rhythm and normal heart sounds.  No murmur heard. Pulmonary/Chest: Effort normal and breath sounds normal. Right breast exhibits no mass, no nipple discharge, no skin change and no tenderness. Left breast exhibits no mass, no nipple discharge, no skin change and no tenderness.  Abdominal: Soft. There is no tenderness. There is no guarding.  Musculoskeletal: Normal range of motion.  Neurological: She is alert and oriented to person, place, and time. No cranial nerve deficit.  Psychiatric: She has a normal mood and affect. Her behavior is normal.  Vitals reviewed.   Results: Results for orders placed or performed in visit on 03/13/17 (from the past 24 hour(s))  POCT Occult Blood Stool     Status: Normal   Collection Time: 03/13/17  4:31 PM  Result Value Ref Range   Fecal Occult Blood, POC Negative Negative   Card #1 Date     Card #2 Fecal Occult Blod, POC     Card #2 Date     Card #3 Fecal Occult Blood, POC     Card #3 Date      Assessment/Plan:  Encounter for annual routine gynecological examination  Screening for breast cancer - Pt to sched  mammo 1/20. - Plan: MM DIGITAL SCREENING BILATERAL  Herpes simplex vulvovaginitis - Pt will call for valtrex prn.  Screening for colon cancer - Neg FOBT. Refer to GI for scr colonoscopy.  - Plan: POCT Occult Blood Stool, Ambulatory referral to Gastroenterology        GYN counsel menopause, adequate intake of calcium and vitamin D, diet and exercise    F/U  Return in about 1 year (around 03/13/2018).  Zooey Schreurs B. Macayla Ekdahl, PA-C 03/13/2017 4:35 PM

## 2017-03-16 ENCOUNTER — Telehealth: Payer: Self-pay

## 2017-03-16 NOTE — Telephone Encounter (Signed)
Gastroenterology Pre-Procedure Review  Request Date: 04/06/17  Requesting Physician: Dr. Tobi BastosAnna   PATIENT REVIEW QUESTIONS: The patient responded to the following health history questions as indicated:    1. Are you having any GI issues? No  2. Do you have a personal history of Polyps? No  3. Do you have a family history of Colon Cancer or Polyps? No  4. Diabetes Mellitus? No  5. Joint replacements in the past 12 months? No  6. Major health problems in the past 3 months? No  7. Any artificial heart valves, MVP, or defibrillator? No     MEDICATIONS & ALLERGIES:    Patient reports the following regarding taking any anticoagulation/antiplatelet therapy:   Plavix, Coumadin, Eliquis, Xarelto, Lovenox, Pradaxa, Brilinta, or Effient? No  Aspirin? No   Patient confirms/reports the following medications:  Current Outpatient Medications  Medication Sig Dispense Refill  . ibuprofen (ADVIL,MOTRIN) 200 MG tablet Take 200 mg by mouth every 6 (six) hours as needed.    . valACYclovir (VALTREX) 500 MG tablet Take 1 tablet (500 mg total) by mouth every 30 (thirty) days. (Patient taking differently: Take 500 mg by mouth as needed. ) 90 tablet 0   No current facility-administered medications for this visit.     Patient confirms/reports the following allergies:  No Known Allergies  No orders of the defined types were placed in this encounter.   AUTHORIZATION INFORMATION Primary Insurance: 1D#: Group #:  Secondary Insurance: 1D#: Group #:  SCHEDULE INFORMATION: Date: 04/06/17 Time: Location: [facility]

## 2017-03-18 ENCOUNTER — Other Ambulatory Visit: Payer: Self-pay

## 2017-03-18 DIAGNOSIS — Z1211 Encounter for screening for malignant neoplasm of colon: Secondary | ICD-10-CM

## 2017-04-05 ENCOUNTER — Encounter: Payer: Self-pay | Admitting: Emergency Medicine

## 2017-04-06 ENCOUNTER — Ambulatory Visit
Admission: RE | Admit: 2017-04-06 | Discharge: 2017-04-06 | Disposition: A | Discharge status: Home / Self Care | Payer: BLUE CROSS/BLUE SHIELD | Source: Ambulatory Visit | Attending: Gastroenterology | Admitting: Gastroenterology

## 2017-04-06 ENCOUNTER — Other Ambulatory Visit: Payer: Self-pay

## 2017-04-06 ENCOUNTER — Encounter: Payer: Self-pay | Admitting: *Deleted

## 2017-04-06 ENCOUNTER — Encounter
Admission: RE | Disposition: A | Discharge status: Home / Self Care | Payer: Self-pay | Source: Ambulatory Visit | Attending: Gastroenterology

## 2017-04-06 ENCOUNTER — Ambulatory Visit: Payer: BLUE CROSS/BLUE SHIELD | Admitting: Anesthesiology

## 2017-04-06 DIAGNOSIS — Z1211 Encounter for screening for malignant neoplasm of colon: Secondary | ICD-10-CM | POA: Diagnosis not present

## 2017-04-06 HISTORY — PX: COLONOSCOPY WITH PROPOFOL: SHX5780

## 2017-04-06 SURGERY — COLONOSCOPY WITH PROPOFOL
Anesthesia: General

## 2017-04-06 MED ORDER — LIDOCAINE 2% (20 MG/ML) 5 ML SYRINGE
INTRAMUSCULAR | Status: DC | PRN
Start: 2017-04-06 — End: 2017-04-06
  Administered 2017-04-06: 30 mg via INTRAVENOUS

## 2017-04-06 MED ORDER — PROPOFOL 500 MG/50ML IV EMUL
INTRAVENOUS | Status: DC | PRN
  Administered 2017-04-06: 180 ug/kg/min via INTRAVENOUS

## 2017-04-06 MED ORDER — PROPOFOL 10 MG/ML IV BOLUS
INTRAVENOUS | Status: DC | PRN
  Administered 2017-04-06: 100 mg via INTRAVENOUS
  Administered 2017-04-06: 50 mg via INTRAVENOUS

## 2017-04-06 MED ORDER — SODIUM CHLORIDE 0.9 % IV SOLN
INTRAVENOUS | Status: DC
  Administered 2017-04-06: 10:00:00 via INTRAVENOUS

## 2017-04-06 MED ORDER — PHENYLEPHRINE HCL 10 MG/ML IJ SOLN
INTRAMUSCULAR | Status: DC | PRN
  Administered 2017-04-06: 100 ug via INTRAVENOUS

## 2017-04-06 MED ORDER — MIDAZOLAM HCL 5 MG/5ML IJ SOLN
INTRAMUSCULAR | Status: DC | PRN
  Administered 2017-04-06: 1 mg via INTRAVENOUS

## 2017-04-06 MED ORDER — FENTANYL CITRATE (PF) 100 MCG/2ML IJ SOLN
INTRAMUSCULAR | Status: AC
  Filled 2017-04-06: qty 2

## 2017-04-06 MED ORDER — FENTANYL CITRATE (PF) 100 MCG/2ML IJ SOLN
INTRAMUSCULAR | Status: DC | PRN
  Administered 2017-04-06 (×2): 50 ug via INTRAVENOUS

## 2017-04-06 MED ORDER — MIDAZOLAM HCL 2 MG/2ML IJ SOLN
INTRAMUSCULAR | Status: AC
  Filled 2017-04-06: qty 2

## 2017-04-06 NOTE — Transfer of Care (Signed)
Immediate Anesthesia Transfer of Care Note  Patient: Mercedes FossaSuzanne E Daniels  Procedure(s) Performed: COLONOSCOPY WITH PROPOFOL (N/A )  Patient Location: PACU and Endoscopy Unit  Anesthesia Type:General  Level of Consciousness: awake, drowsy and patient cooperative  Airway & Oxygen Therapy: Patient Spontanous Breathing and Patient connected to nasal cannula oxygen  Post-op Assessment: Report given to RN and Post -op Vital signs reviewed and stable  Post vital signs: Reviewed and stable  Last Vitals:  Vitals Value Taken Time  BP    Temp    Pulse    Resp    SpO2      Last Pain:  Vitals:   04/06/17 0858  TempSrc: Tympanic         Complications: No apparent anesthesia complications

## 2017-04-06 NOTE — Anesthesia Preprocedure Evaluation (Signed)
Anesthesia Evaluation  Patient identified by MRN, date of birth, ID band Patient awake    Reviewed: Allergy & Precautions, H&P , NPO status , reviewed documented beta blocker date and time   Airway Mallampati: I  TM Distance: >3 FB     Dental no notable dental hx.    Pulmonary neg pulmonary ROS,    Pulmonary exam normal        Cardiovascular negative cardio ROS Normal cardiovascular exam     Neuro/Psych negative neurological ROS  negative psych ROS   GI/Hepatic Neg liver ROS,   Endo/Other  negative endocrine ROS  Renal/GU negative Renal ROS Bladder dysfunction      Musculoskeletal   Abdominal   Peds  Hematology negative hematology ROS (+)   Anesthesia Other Findings Anesthesia History    Genital herpes Breast cyst, left    Surgical History    Cyst under tongue breast lift 2008  Reproductive/Obstetrics                             Anesthesia Physical Anesthesia Plan  ASA: II  Anesthesia Plan: General   Post-op Pain Management:    Induction:   PONV Risk Score and Plan: 3 and Propofol infusion  Airway Management Planned:   Additional Equipment:   Intra-op Plan:   Post-operative Plan:   Informed Consent: I have reviewed the patients History and Physical, chart, labs and discussed the procedure including the risks, benefits and alternatives for the proposed anesthesia with the patient or authorized representative who has indicated his/her understanding and acceptance.   Dental Advisory Given  Plan Discussed with: CRNA  Anesthesia Plan Comments:         Anesthesia Quick Evaluation

## 2017-04-06 NOTE — Op Note (Signed)
[facility]enter[facility] Patient Name: Mercedes Daniels Procedure Date: 04/06/2017 9:42 AM MRN: [id number] Account #: [id number] Date of Birth: [birth year] Admit Type: Outpatient Age: 52 Room: [facility] ENDO ROOM 3 Gender: Female Note Status: Finalized Procedure:            Colonoscopy Indications:          Screening for colorectal malignant neoplasm Providers:            Wyline Mood MD, MD Referring MD:         Karie Schwalbe (Referring MD) Medicines:            Monitored Anesthesia Care Complications:        No immediate complications. Procedure:            Pre-Anesthesia Assessment:                       - Prior to the procedure, a History and Physical was                        performed, and patient medications, allergies and                        sensitivities were reviewed. The patient's tolerance of                        previous anesthesia was reviewed.                       - The risks and benefits of the procedure and the                        sedation options and risks were discussed with the                        patient. All questions were answered and informed                        consent was obtained.                       - ASA Grade Assessment: II - A patient with mild                        systemic disease.                       After obtaining informed consent, the colonoscope was                        passed under direct vision. Throughout the procedure,                        the patient's blood pressure, pulse, and oxygen                        saturations were monitored continuously. The                        Colonoscope was introduced through the anus and  advanced to the the cecum, identified by the                        appendiceal orifice, IC valve and transillumination.                        The colonoscopy was performed with ease. The patient                        tolerated the procedure well. The  quality of the bowel                        preparation was good. Findings:      The perianal and digital rectal examinations were normal.      The entire examined colon appeared normal on direct and retroflexion       views. Impression:           - The entire examined colon is normal on direct and                        retroflexion views.                       - No specimens collected. Recommendation:       - Discharge patient to home (with escort).                       - Resume previous diet.                       - Continue present medications.                       - Repeat colonoscopy in 10 years for screening purposes. Procedure Code(s):    --- Professional ---                       [id number], Colonoscopy, flexible; diagnostic, including                        collection of specimen(s) by brushing or washing, when                        performed (separate procedure) Diagnosis Code(s):    --- Professional ---                       Z12.11, Encounter for screening for malignant neoplasm                        of colon CPT copyright 2016 American Medical Association. All rights reserved. The codes documented in this report are preliminary and upon coder review may  be revised to meet current compliance requirements. Wyline MoodKiran Shanai Lartigue, MD Wyline MoodKiran Korene Dula MD, MD 04/06/2017 10:10:22 AM This report has been signed electronically. Number of Addenda: 0 Note Initiated On: 04/06/2017 9:42 AM Scope Withdrawal Time: 0 hours 9 minutes 32 seconds  Total Procedure Duration: 0 hours 16 minutes 16 seconds       [facility]

## 2017-04-06 NOTE — Anesthesia Postprocedure Evaluation (Signed)
Anesthesia Post Note  Patient: Mercedes Daniels  Procedure(s) Performed: COLONOSCOPY WITH PROPOFOL (N/A )  Patient location during evaluation: Endoscopy Anesthesia Type: General Level of consciousness: awake and alert Pain management: pain level controlled Vital Signs Assessment: post-procedure vital signs reviewed and stable Respiratory status: spontaneous breathing, nonlabored ventilation and respiratory function stable Cardiovascular status: blood pressure returned to baseline and stable Postop Assessment: no apparent nausea or vomiting Anesthetic complications: no     Last Vitals:  Vitals Value Taken Time  BP    Temp    Pulse    Resp    SpO2      Last Pain:  Vitals:   04/06/17 1043  TempSrc:   PainSc: 0-No pain                 Christia ReadingScott T Maritza [place]

## 2017-04-06 NOTE — Anesthesia Post-op Follow-up Note (Signed)
Anesthesia QCDR form completed.        

## 2017-04-06 NOTE — H&P (Signed)
Mercedes MoodKiran Aramis Zobel, MD [address], [address], [place], [place], [id number] [address], [address], [place], [place], [address] Phone: [phone]  Fax: [phone]  Primary Care Physician:  Karie SchwalbeLetvak, Richard I, MD   Pre-Procedure History & Physical: HPI:  Mercedes FossaSuzanne E Daniels is a 52 y.o. female is here for an colonoscopy.   Past Medical History:  Diagnosis Date  . Breast cyst, left   . Genital herpes     Past Surgical History:  Procedure Laterality Date  . breast lift  2008   [place]  . Cyst under tongue     As a child    Prior to Admission medications   Medication Sig Start Date End Date Taking? Authorizing Provider  ibuprofen (ADVIL,MOTRIN) 200 MG tablet Take 200 mg by mouth every 6 (six) hours as needed.   Yes [provider]  valACYclovir (VALTREX) 500 MG tablet Take 1 tablet (500 mg total) by mouth every 30 (thirty) days. Patient taking differently: Take 500 mg by mouth as needed.  01/04/17  Yes Copland, Alicia B, PA-C    Allergies as of 03/20/2017  . (No Known Allergies)    Family History  Problem Relation Age of Onset  . Cancer Father        Prostate  . Breast cancer Maternal Grandmother [id number]  . Hypertension Neg Hx   . Heart disease Neg Hx   . Diabetes Neg Hx     Social History   Socioeconomic History  . Marital status: Married    Spouse name: Not on file  . Number of children: 0  . Years of education: Not on file  . Highest education level: Not on file  Occupational History  . Occupation: HR Production designer, theatre/television/filmmanager    Comment: Nurse, mental healtht [employer]  Social Needs  . Financial resource strain: Not on file  . Food insecurity:    Worry: Not on file    Inability: Not on file  . Transportation needs:    Medical: Not on file    Non-medical: Not on file  Tobacco Use  . Smoking status: Never Smoker  . Smokeless tobacco: Never Used  Substance and Sexual Activity  . Alcohol use: Yes    Alcohol/week: 1.2 oz    Types: 2 Glasses of wine per week  . Drug use: No  .  Sexual activity: Yes    Birth control/protection: None, Surgical    Comment: vasectomy   Lifestyle  . Physical activity:    Days per week: Not on file    Minutes per session: Not on file  . Stress: Not on file  Relationships  . Social connections:    Talks on phone: Not on file    Gets together: Not on file    Attends religious service: Not on file    Active member of club or organization: Not on file    Attends meetings of clubs or organizations: Not on file    Relationship status: Not on file  . Intimate partner violence:    Fear of current or ex partner: Not on file    Emotionally abused: Not on file    Physically abused: Not on file    Forced sexual activity: Not on file  Other Topics Concern  . Not on file  Social History Narrative   Married, 2 step-children    Review of Systems: See HPI, otherwise negative ROS  Physical Exam: BP 107/67   Pulse 73   Temp (!) 97.5 F (36.4 C) (Tympanic)  Resp 18   Ht 5\' 4"  (1.626 m)   Wt 130 lb (59 kg)   LMP 03/03/2017 (Approximate)   SpO2 99%   BMI 22.31 kg/m  General:   Alert,  pleasant and cooperative in NAD Head:  Normocephalic and atraumatic. Neck:  Supple; no masses or thyromegaly. Lungs:  Clear throughout to auscultation, normal respiratory effort.    Heart:  +S1, +S2, Regular rate and rhythm, No edema. Abdomen:  Soft, nontender and nondistended. Normal bowel sounds, without guarding, and without rebound.   Neurologic:  Alert and  oriented x4;  grossly normal neurologically.  Impression/Plan: Mercedes Daniels is here for an colonoscopy to be performed for Screening colonoscopy average risk   Risks, benefits, limitations, and alternatives regarding  colonoscopy have been reviewed with the patient.  Questions have been answered.  All parties agreeable.   Mercedes Mood, MD  04/06/2017, 9:17 AM

## 2017-04-07 ENCOUNTER — Encounter: Payer: Self-pay | Admitting: Gastroenterology

## 2017-12-22 DIAGNOSIS — D2262 Melanocytic nevi of left upper limb, including shoulder: Secondary | ICD-10-CM | POA: Diagnosis not present

## 2017-12-22 DIAGNOSIS — D2261 Melanocytic nevi of right upper limb, including shoulder: Secondary | ICD-10-CM | POA: Diagnosis not present

## 2017-12-22 DIAGNOSIS — D2272 Melanocytic nevi of left lower limb, including hip: Secondary | ICD-10-CM | POA: Diagnosis not present

## 2017-12-22 DIAGNOSIS — D225 Melanocytic nevi of trunk: Secondary | ICD-10-CM | POA: Diagnosis not present

## 2018-02-23 DIAGNOSIS — Z1231 Encounter for screening mammogram for malignant neoplasm of breast: Secondary | ICD-10-CM | POA: Diagnosis not present

## 2018-02-26 ENCOUNTER — Encounter: Payer: Self-pay | Admitting: Obstetrics and Gynecology

## 2018-03-13 NOTE — Patient Instructions (Signed)
I value your feedback and entrusting us with your care. If you get a [facility] patient survey, I would appreciate you taking the time to let us know about your experience today. Thank you! 

## 2018-03-13 NOTE — Progress Notes (Signed)
PCP: Karie Schwalbe, MD   Chief Complaint  Patient presents with  . Gynecologic Exam    HPI:      [name] is a 53 y.o. G0P0000 who LMP was Patient's last menstrual period was 02/17/2018 (approximate)., presents today for her annual examination.  Her menses are regular every 28-30 days, lasting 3-4 days.  Dysmenorrhea mild, improved with NSAIDs. She does not have intermenstrual bleeding.  She does not have vasomotor sx.   Sex activity: single partner, contraception - vasectomy. She does not have vaginal dryness.  Last Pap: January 07, 2015  Results were: no abnormalities /neg HPV DNA.  Hx of STDs: HSV, takes valtrex prn sx. Needs RF  Last mammogram: 02/23/18  Results were: normal--routine follow-up in 12 months, followed by Dr. Lemar Livings in past for stable LT breast cysts. There is a FH of breast cancer in her MGM, genetic testing not indicated. There is no FH of ovarian cancer. The patient does not do self-breast exams.  Colonoscopy: 2019 without abnormalities; repeat after 10 yrs.  GI.  Tobacco use: The patient denies current or previous tobacco use. Alcohol use: social drinker Exercise: very active  She does get adequate calcium and Vitamin D in her diet.  Fasting labs due.   Past Medical History:  Diagnosis Date  . Breast cyst, left   . Genital herpes     Past Surgical History:  Procedure Laterality Date  . breast lift  2008   [place]  . COLONOSCOPY WITH PROPOFOL N/A 04/06/2017   Procedure: COLONOSCOPY WITH PROPOFOL;  Surgeon: Wyline Mood, MD;  Location[facility] ENDOSCOPY;  Service: Gastroenterology;  Laterality: N/A;  . Cyst under tongue     As a child    Family History  Problem Relation Age of Onset  . Cancer Father        Prostate  . Breast cancer Maternal Grandmother 7  . Hypertension Neg Hx   . Heart disease Neg Hx   . Diabetes Neg Hx     Social History   Socioeconomic History  . Marital status: Married    Spouse name: Not on  file  . Number of children: 0  . Years of education: Not on file  . Highest education level: Not on file  Occupational History  . Occupation: HR Production designer, theatre/television/film    Comment: Nurse, mental health  Social Needs  . Financial resource strain: Not on file  . Food insecurity:    Worry: Not on file    Inability: Not on file  . Transportation needs:    Medical: Not on file    Non-medical: Not on file  Tobacco Use  . Smoking status: Never Smoker  . Smokeless tobacco: Never Used  Substance and Sexual Activity  . Alcohol use: Yes    Alcohol/week: 2.0 standard drinks    Types: 2 Glasses of wine per week  . Drug use: No  . Sexual activity: Yes    Birth control/protection: None, Surgical    Comment: vasectomy   Lifestyle  . Physical activity:    Days per week: Not on file    Minutes per session: Not on file  . Stress: Not on file  Relationships  . Social connections:    Talks on phone: Not on file    Gets together: Not on file    Attends religious service: Not on file    Active member of club or organization: Not on file    Attends meetings of clubs or organizations:  Not on file    Relationship status: Not on file  . Intimate partner violence:    Fear of current or ex partner: Not on file    Emotionally abused: Not on file    Physically abused: Not on file    Forced sexual activity: Not on file  Other Topics Concern  . Not on file  Social History Narrative   Married, 2 step-children    Current Meds  Medication Sig  . ibuprofen (ADVIL,MOTRIN) 200 MG tablet Take 200 mg by mouth every 6 (six) hours as needed.  . RESTASIS 0.05 % ophthalmic emulsion INSTILL 1 DROP INTO BOTH EYES TWICE A DAY AS DIRECTED  . valACYclovir (VALTREX) 500 MG tablet Take 1 tablet (500 mg total) by mouth 2 (two) times daily. For 3 days prn sx  . [DISCONTINUED] valACYclovir (VALTREX) 500 MG tablet Take 1 tablet (500 mg total) by mouth every 30 (thirty) days. (Patient taking differently: Take 500 mg by mouth as needed. )       ROS:  Review of Systems  Constitutional: Negative for fatigue, fever and unexpected weight change.  Respiratory: Negative for cough, shortness of breath and wheezing.   Cardiovascular: Negative for chest pain, palpitations and leg swelling.  Gastrointestinal: Negative for blood in stool, constipation, diarrhea, nausea and vomiting.  Endocrine: Negative for cold intolerance, heat intolerance and polyuria.  Genitourinary: Negative for dyspareunia, dysuria, flank pain, frequency, genital sores, hematuria, menstrual problem, pelvic pain, urgency, vaginal bleeding, vaginal discharge and vaginal pain.  Musculoskeletal: Negative for back pain, joint swelling and myalgias.  Skin: Negative for rash.  Neurological: Negative for dizziness, syncope, light-headedness, numbness and headaches.  Hematological: Negative for adenopathy.  Psychiatric/Behavioral: Negative for agitation, confusion, sleep disturbance and suicidal ideas. The patient is not nervous/anxious.      Objective: BP 110/70   Pulse 65   Ht 5\' 4"  (1.626 m)   Wt 140 lb (63.5 kg)   LMP 02/17/2018 (Approximate)   BMI 24.03 kg/m    Physical Exam Constitutional:      Appearance: She is well-developed.  Genitourinary:     Vulva, vagina, uterus, right adnexa, left adnexa and rectum normal.     No vulval lesion or tenderness noted.     No vaginal discharge, erythema or tenderness.     No cervical motion tenderness or polyp.     Uterus is not enlarged or tender.     No right or left adnexal mass present.     Right adnexa not tender.     Left adnexa not tender.  Rectum:     Guaiac result negative.     No rectal mass or anal fissure.  Neck:     Musculoskeletal: Normal range of motion.     Thyroid: No thyromegaly.  Cardiovascular:     Rate and Rhythm: Normal rate and regular rhythm.     Heart sounds: Normal heart sounds. No murmur.  Pulmonary:     Effort: Pulmonary effort is normal.     Breath sounds: Normal breath  sounds.  Chest:     Breasts:        Right: No mass, nipple discharge, skin change or tenderness.        Left: No mass, nipple discharge, skin change or tenderness.  Abdominal:     Palpations: Abdomen is soft.     Tenderness: There is no abdominal tenderness. There is no guarding.  Musculoskeletal: Normal range of motion.  Neurological:     Mental Status: She is  alert and oriented to person, place, and time.     Cranial Nerves: No cranial nerve deficit.  Psychiatric:        Behavior: Behavior normal.  Vitals signs reviewed.     Assessment/Plan:  Encounter for annual routine gynecological examination  Cervical cancer screening - Plan: Cytology - PAP  Screening for HPV (human papillomavirus) - Plan: Cytology - PAP  Screening for breast cancer - Pt current on mammo 2/20  Herpes simplex vulvovaginitis - Rx RF valtrex - Plan: valACYclovir (VALTREX) 500 MG tablet  Blood tests for routine general physical examination - Plan: Comprehensive metabolic panel, Lipid panel  Screening cholesterol level - Plan: Lipid panel        Meds ordered this encounter  Medications  . valACYclovir (VALTREX) 500 MG tablet    Sig: Take 1 tablet (500 mg total) by mouth 2 (two) times daily. For 3 days prn sx    Dispense:  30 tablet    Refill:  1    Order Specific Question:   Supervising Provider    Answer:   Nadara Mustard [[id number]]    GYN counsel menopause, adequate intake of calcium and vitamin D, diet and exercise    F/U  Return in about 1 year (around 03/15/2019).  Alicia B. Copland, PA-C 03/14/2018 9:11 AM

## 2018-03-14 ENCOUNTER — Ambulatory Visit (INDEPENDENT_AMBULATORY_CARE_PROVIDER_SITE_OTHER): Payer: BLUE CROSS/BLUE SHIELD | Admitting: Obstetrics and Gynecology

## 2018-03-14 ENCOUNTER — Other Ambulatory Visit (HOSPITAL_COMMUNITY)
Admission: RE | Admit: 2018-03-14 | Discharge: 2018-03-14 | Disposition: A | Discharge status: Home / Self Care | Payer: BLUE CROSS/BLUE SHIELD | Source: Ambulatory Visit | Attending: Obstetrics and Gynecology | Admitting: Obstetrics and Gynecology

## 2018-03-14 ENCOUNTER — Encounter: Payer: Self-pay | Admitting: Obstetrics and Gynecology

## 2018-03-14 VITALS — BP 110/70 | HR 65 | Ht 64.0 in | Wt 140.0 lb

## 2018-03-14 DIAGNOSIS — Z1239 Encounter for other screening for malignant neoplasm of breast: Secondary | ICD-10-CM

## 2018-03-14 DIAGNOSIS — A6004 Herpesviral vulvovaginitis: Secondary | ICD-10-CM

## 2018-03-14 DIAGNOSIS — Z1151 Encounter for screening for human papillomavirus (HPV): Secondary | ICD-10-CM | POA: Diagnosis not present

## 2018-03-14 DIAGNOSIS — Z0000 Encounter for general adult medical examination without abnormal findings: Secondary | ICD-10-CM

## 2018-03-14 DIAGNOSIS — Z124 Encounter for screening for malignant neoplasm of cervix: Secondary | ICD-10-CM | POA: Diagnosis not present

## 2018-03-14 DIAGNOSIS — Z01419 Encounter for gynecological examination (general) (routine) without abnormal findings: Secondary | ICD-10-CM

## 2018-03-14 DIAGNOSIS — Z13220 Encounter for screening for lipoid disorders: Secondary | ICD-10-CM | POA: Diagnosis not present

## 2018-03-14 MED ORDER — VALACYCLOVIR HCL 500 MG PO TABS: 500.0000 mg | ORAL_TABLET | Freq: Two times a day (BID) | ORAL | 1 refills | Status: DC

## 2018-03-15 LAB — COMPREHENSIVE METABOLIC PANEL
ALT: 14 IU/L (ref 0–32)
AST: 18 IU/L (ref 0–40)
Albumin/Globulin Ratio: 1.5 (ref 1.2–2.2)
Albumin: 4.2 g/dL (ref 3.8–4.9)
Alkaline Phosphatase: 57 IU/L (ref 39–117)
BUN/Creatinine Ratio: 20 (ref 9–23)
BUN: 16 mg/dL (ref 6–24)
Bilirubin Total: 0.6 mg/dL (ref 0.0–1.2)
CALCIUM: 9.2 mg/dL (ref 8.7–10.2)
CO2: 24 mmol/L (ref 20–29)
CREATININE: 0.82 mg/dL (ref 0.57–1.00)
Chloride: 100 mmol/L (ref 96–106)
GFR calc Af Amer: 96 mL/min/{1.73_m2} (ref >59)
GFR, EST NON AFRICAN AMERICAN: 83 mL/min/{1.73_m2} (ref >59)
GLOBULIN, TOTAL: 2.8 g/dL (ref 1.5–4.5)
GLUCOSE: 76 mg/dL (ref 65–99)
Potassium: 4 mmol/L (ref 3.5–5.2)
SODIUM: 138 mmol/L (ref 134–144)
Total Protein: 7 g/dL (ref 6.0–8.5)

## 2018-03-15 LAB — LIPID PANEL
CHOL/HDL RATIO: 2.1 ratio (ref 0.0–4.4)
Cholesterol, Total: 203 mg/dL — ABNORMAL HIGH (ref 100–199)
HDL: 96 mg/dL (ref >39)
LDL CALC: 96 mg/dL (ref 0–99)
TRIGLYCERIDES: 54 mg/dL (ref 0–149)
VLDL Cholesterol Cal: 11 mg/dL (ref 5–40)

## 2018-03-15 NOTE — Progress Notes (Signed)
Pls let pt know labs WNL. Thx

## 2018-03-15 NOTE — Progress Notes (Signed)
Called, no answer, left detailed msg about results.

## 2018-03-16 LAB — CYTOLOGY - PAP
Diagnosis: NEGATIVE
HPV: NOT DETECTED

## 2018-03-20 DIAGNOSIS — M1812 Unilateral primary osteoarthritis of first carpometacarpal joint, left hand: Secondary | ICD-10-CM | POA: Diagnosis not present

## 2018-08-13 ENCOUNTER — Other Ambulatory Visit: Payer: Self-pay | Admitting: Obstetrics and Gynecology

## 2018-08-13 DIAGNOSIS — A6004 Herpesviral vulvovaginitis: Secondary | ICD-10-CM

## 2018-08-28 ENCOUNTER — Other Ambulatory Visit: Payer: Self-pay | Admitting: Obstetrics and Gynecology

## 2018-08-28 DIAGNOSIS — A6004 Herpesviral vulvovaginitis: Secondary | ICD-10-CM

## 2018-10-03 ENCOUNTER — Other Ambulatory Visit: Payer: Self-pay

## 2018-10-03 DIAGNOSIS — Z20822 Contact with and (suspected) exposure to covid-19: Secondary | ICD-10-CM

## 2018-10-03 DIAGNOSIS — R6889 Other general symptoms and signs: Secondary | ICD-10-CM | POA: Diagnosis not present

## 2018-10-04 LAB — NOVEL CORONAVIRUS, NAA: SARS-CoV-2, NAA: NOT DETECTED

## 2018-12-03 ENCOUNTER — Telehealth: Payer: Self-pay | Admitting: Obstetrics and Gynecology

## 2018-12-03 ENCOUNTER — Other Ambulatory Visit: Payer: Self-pay | Admitting: Obstetrics and Gynecology

## 2018-12-03 DIAGNOSIS — A6004 Herpesviral vulvovaginitis: Secondary | ICD-10-CM

## 2018-12-03 MED ORDER — VALACYCLOVIR HCL 500 MG PO TABS: ORAL_TABLET | ORAL | 1 refills | Status: DC

## 2018-12-03 NOTE — Progress Notes (Signed)
Rx RF valtrex prn sx 

## 2018-12-03 NOTE — Telephone Encounter (Signed)
Pt aware.

## 2018-12-03 NOTE — Telephone Encounter (Signed)
Please see

## 2018-12-03 NOTE — Telephone Encounter (Signed)
Patient requesting refill for Valtrex, annual scheduled 03/18/2018 with [facility].  [facility].

## 2018-12-03 NOTE — Telephone Encounter (Signed)
Rx RF eRxd.  

## 2018-12-14 ENCOUNTER — Other Ambulatory Visit: Payer: Self-pay | Admitting: Obstetrics and Gynecology

## 2018-12-14 DIAGNOSIS — Z20828 Contact with and (suspected) exposure to other viral communicable diseases: Secondary | ICD-10-CM | POA: Diagnosis not present

## 2018-12-14 DIAGNOSIS — A6004 Herpesviral vulvovaginitis: Secondary | ICD-10-CM

## 2018-12-14 DIAGNOSIS — R5383 Other fatigue: Secondary | ICD-10-CM | POA: Diagnosis not present

## 2018-12-14 DIAGNOSIS — R519 Headache, unspecified: Secondary | ICD-10-CM | POA: Diagnosis not present

## 2018-12-15 DIAGNOSIS — R5383 Other fatigue: Secondary | ICD-10-CM | POA: Diagnosis not present

## 2018-12-15 DIAGNOSIS — Z20828 Contact with and (suspected) exposure to other viral communicable diseases: Secondary | ICD-10-CM | POA: Diagnosis not present

## 2018-12-15 DIAGNOSIS — R519 Headache, unspecified: Secondary | ICD-10-CM | POA: Diagnosis not present

## 2018-12-17 ENCOUNTER — Telehealth: Payer: Self-pay | Admitting: Internal Medicine

## 2018-12-17 NOTE — Telephone Encounter (Signed)
Okay to add on tomorrow Not sure any prescriptions are needed

## 2018-12-17 NOTE — Telephone Encounter (Signed)
Patient tested positive for covid.  Patient has body aches and congestion and cough.  Patient wants to know if something can be called in to [place] for her symptoms.

## 2018-12-17 NOTE — Telephone Encounter (Signed)
Pt was added. Made her aware on her husband's message.

## 2018-12-17 NOTE — Telephone Encounter (Signed)
Pt has only seen Dr Silvio Pate once in 2016. Last seen by anyone in our office was 02/2017 for Bronchitis

## 2018-12-18 ENCOUNTER — Other Ambulatory Visit: Payer: Self-pay

## 2018-12-18 ENCOUNTER — Encounter: Payer: Self-pay | Admitting: Internal Medicine

## 2018-12-18 ENCOUNTER — Ambulatory Visit (INDEPENDENT_AMBULATORY_CARE_PROVIDER_SITE_OTHER): Payer: BC Managed Care – PPO | Admitting: Internal Medicine

## 2018-12-18 DIAGNOSIS — U071 COVID-19: Secondary | ICD-10-CM | POA: Diagnosis not present

## 2018-12-18 NOTE — Progress Notes (Signed)
Subjective:    Patient ID: Mercedes Daniels, female    DOB: 23-Jul-1965, 53 y.o.   MRN: [id number]  HPI Video virtual visit for respiratory symptoms Recent COVID diagnosis Identification done Reviewed billing and she gave consent She is at home and I am in my office  Was exposed at MIL's house on 11/22 She had been exposed at her work 10 days earlier Symptoms started 11/25 or 11/26 Back aching was the main initial symptom Fatigue started then  Also with bad head congestion--had SOB sensation on the stairs (very unusual for her), but mostly today No fever at all Has felt hot but not feverish No chills or sweats Some cough --mostly clearing throat  Taking tylenol, advil, dayquil--some help  Current Outpatient Medications on File Prior to Visit  Medication Sig Dispense Refill  . ibuprofen (ADVIL,MOTRIN) 200 MG tablet Take 200 mg by mouth every 6 (six) hours as needed.    . RESTASIS 0.05 % ophthalmic emulsion INSTILL 1 DROP INTO BOTH EYES TWICE A DAY AS DIRECTED    . valACYclovir (VALTREX) 500 MG tablet TAKE 1 TABLET BY MOUTH 2 TIMES DAILY FOR 3 DAYS AS NEEDED FOR SYMPTOMS 30 tablet 1   No current facility-administered medications on file prior to visit.     No Known Allergies  Past Medical History:  Diagnosis Date  . Breast cyst, left   . Genital herpes     Past Surgical History:  Procedure Laterality Date  . breast lift  2008   [place]  . COLONOSCOPY WITH PROPOFOL N/A 04/06/2017   Procedure: COLONOSCOPY WITH PROPOFOL;  Surgeon: Wyline Mood, MD;  [facility] END[facility]COPY;  Service: Gastroenterology;  Laterality: N/A;  . Cyst under tongue     As a child    Family History  Problem Relation Age of Onset  . Cancer Father        Prostate  . Breast cancer Maternal Grandmother 39  . Hypertension Neg Hx   . Heart disease Neg Hx   . Diabetes Neg Hx     Social History   Socioeconomic History  . Marital status: Married    Spouse name: Not on file  . Number of  children: 0  . Years of education: Not on file  . Highest education level: Not on file  Occupational History  . Occupation: HR Production designer, theatre/television/film    Comment: Nurse, mental health  Social Needs  . Financial resource strain: Not on file  . Food insecurity    Worry: Not on file    Inability: Not on file  . Transportation needs    Medical: Not on file    Non-medical: Not on file  Tobacco Use  . Smoking status: Never Smoker  . Smokeless tobacco: Never Used  Substance and Sexual Activity  . Alcohol use: Yes    Alcohol/week: 2.0 standard drinks    Types: 2 Glasses of wine per week  . Drug use: No  . Sexual activity: Yes    Birth control/protection: None, Surgical    Comment: vasectomy   Lifestyle  . Physical activity    Days per week: Not on file    Minutes per session: Not on file  . Stress: Not on file  Relationships  . Social Musician on phone: Not on file    Gets together: Not on file    Attends religious service: Not on file    Active member of club or organization: Not on file    Attends  meetings of clubs or organizations: Not on file    Relationship status: Not on file  . Intimate partner violence    Fear of current or ex partner: Not on file    Emotionally abused: Not on file    Physically abused: Not on file    Forced sexual activity: Not on file  Other Topics Concern  . Not on file  Social History Narrative   Married, 2 step-children   Review of Systems Hasn't lost sense of smell or taste No N/V Appetite is okay Working from home for now Getting food delivered    Objective:   Physical Exam  Constitutional: She appears well-developed. No distress.  Respiratory: Effort normal. No respiratory distress.  Psychiatric: She has a normal mood and affect. Her behavior is normal.           Assessment & Plan:

## 2018-12-18 NOTE — Assessment & Plan Note (Signed)
Mostly fatigue and sinus congestion Mild DOE but generally okay Discussed symptom relief If congestion worsens, would consider Rx with steroids ER if sig dyspnea Quarantine till 12/7 as long as symptoms are better or resolved and hasn't gotten fever

## 2018-12-21 ENCOUNTER — Telehealth: Payer: Self-pay | Admitting: Internal Medicine

## 2018-12-21 NOTE — Telephone Encounter (Signed)
Left detailed message on verified VM. 

## 2018-12-21 NOTE — Telephone Encounter (Signed)
She can try decongestants, flonase spray and/or nasal saline spray/irrigations This is still probably just the COVID infection If she is not any better by Monday, I would try antibiotic/steroids

## 2018-12-21 NOTE — Telephone Encounter (Signed)
Patient had virtual visit on Tuesday for covid symptoms and testing positive. She stated that she was advised to call back if things did not seem to be improving or getting worse.  Patient stated that she is having a lot of head/face pressure like a sinus infection and would like to know what could be sent in to help relieve this or what advice you could give to help this.

## 2019-01-31 DIAGNOSIS — N6012 Diffuse cystic mastopathy of left breast: Secondary | ICD-10-CM | POA: Diagnosis not present

## 2019-01-31 DIAGNOSIS — N6009 Solitary cyst of unspecified breast: Secondary | ICD-10-CM | POA: Diagnosis not present

## 2019-02-26 DIAGNOSIS — Z1231 Encounter for screening mammogram for malignant neoplasm of breast: Secondary | ICD-10-CM | POA: Diagnosis not present

## 2019-03-17 NOTE — Progress Notes (Signed)
PCP: Venia Carbon, MD   Chief Complaint  Patient presents with  . Gynecologic Exam    HPI:      Mercedes Daniels is a 54 y.o. G0P0000 who LMP was Patient's last menstrual period was 02/26/2019 (exact date)., presents today for her annual examination.  Her menses are regular every 28-30 days, lasting 3 days.  Dysmenorrhea mild, improved with NSAIDs. She has been having occas intermenstrual bleeding for a day, over the past 6 months.  She does not have vasomotor sx.   Sex activity: single partner, contraception - vasectomy. She has occas vaginal dryness. Has also had occas postcoital bleeding in the past 4 months.  Last Pap: 03/14/18 Results were: no abnormalities /neg HPV DNA.  Hx of STDs: HSV, takes valtrex prn sx. Usually gets outbreaks before/after menses.  Needs RF  Last mammogram: 02/26/19 at [facility], Results were: normal--routine follow-up in 12 months, followed by Dr. Bary Castilla in past for stable LT breast cysts. Had LT breast pain a few months ago and saw Dr. Bary Castilla. Mammo and breast u/s WNL. Sx resolved.   There is a FH of breast cancer in her MGM, genetic testing not indicated. There is no FH of ovarian cancer. The patient does not do self-breast exams.  Colonoscopy: 2019 without abnormalities; repeat after 10 yrs. [facility].  Tobacco use: The patient denies current or previous tobacco use. Alcohol use: social drinker  No drug use Exercise: very active  She does get adequate calcium and Vitamin D in her diet. Normal fasting labs 2020  Past Medical History:  Diagnosis Date  . Breast cyst, left   . Genital herpes     Past Surgical History:  Procedure Laterality Date  . breast lift  2008   [place]  . COLONOSCOPY WITH PROPOFOL N/A 04/06/2017   Procedure: COLONOSCOPY WITH PROPOFOL;  Surgeon: Jonathon Bellows, MD;  Location: [facility] ENDOSCOPY;  Service: Gastroenterology;  Laterality: N/A;  . Cyst under tongue     As a child    Family History  Problem Relation Age of  Onset  . Cancer Father        Prostate  . Breast cancer Maternal Grandmother 3  . Hypertension Neg Hx   . Heart disease Neg Hx   . Diabetes Neg Hx     Social History   Socioeconomic History  . Marital status: Married    Spouse name: Not on file  . Number of children: 0  . Years of education: Not on file  . Highest education level: Not on file  Occupational History  . Occupation: HR Freight forwarder    Comment: Personnel officer  Tobacco Use  . Smoking status: Never Smoker  . Smokeless tobacco: Never Used  Substance and Sexual Activity  . Alcohol use: Yes    Alcohol/week: 2.0 standard drinks    Types: 2 Glasses of wine per week  . Drug use: No  . Sexual activity: Yes    Birth control/protection: None, Surgical    Comment: vasectomy   Other Topics Concern  . Not on file  Social History Narrative   Married, 2 step-children   Social Determinants of Health   Financial Resource Strain:   . Difficulty of Paying Living Expenses: Not on file  Food Insecurity:   . Worried About Charity fundraiser in the Last Year: Not on file  . Ran Out of Food in the Last Year: Not on file  Transportation Needs:   . Lack of Transportation (Medical):  Not on file  . Lack of Transportation (Non-Medical): Not on file  Physical Activity:   . Days of Exercise per Week: Not on file  . Minutes of Exercise per Session: Not on file  Stress:   . Feeling of Stress : Not on file  Social Connections:   . Frequency of Communication with Friends and Family: Not on file  . Frequency of Social Gatherings with Friends and Family: Not on file  . Attends Religious Services: Not on file  . Active Member of Clubs or Organizations: Not on file  . Attends Banker Meetings: Not on file  . Marital Status: Not on file  Intimate Partner Violence:   . Fear of Current or Ex-Partner: Not on file  . Emotionally Abused: Not on file  . Physically Abused: Not on file  . Sexually Abused: Not on file     Current Meds  Medication Sig  . ibuprofen (ADVIL,MOTRIN) 200 MG tablet Take 200 mg by mouth every 6 (six) hours as needed.  . RESTASIS 0.05 % ophthalmic emulsion INSTILL 1 DROP INTO BOTH EYES TWICE A DAY AS DIRECTED  . valACYclovir (VALTREX) 500 MG tablet TAKE 1 TABLET BY MOUTH 2 TIMES DAILY FOR 3 DAYS AS NEEDED FOR SYMPTOMS  . [DISCONTINUED] valACYclovir (VALTREX) 500 MG tablet TAKE 1 TABLET BY MOUTH 2 TIMES DAILY FOR 3 DAYS AS NEEDED FOR SYMPTOMS      ROS:  Review of Systems  Constitutional: Negative for fatigue, fever and unexpected weight change.  Respiratory: Negative for cough, shortness of breath and wheezing.   Cardiovascular: Negative for chest pain, palpitations and leg swelling.  Gastrointestinal: Negative for blood in stool, constipation, diarrhea, nausea and vomiting.  Endocrine: Negative for cold intolerance, heat intolerance and polyuria.  Genitourinary: Positive for menstrual problem. Negative for dyspareunia, dysuria, flank pain, frequency, genital sores, hematuria, pelvic pain, urgency, vaginal bleeding, vaginal discharge and vaginal pain.  Musculoskeletal: Negative for back pain, joint swelling and myalgias.  Skin: Negative for rash.  Neurological: Negative for dizziness, syncope, light-headedness, numbness and headaches.  Hematological: Negative for adenopathy.  Psychiatric/Behavioral: Negative for agitation, confusion, sleep disturbance and suicidal ideas. The patient is not nervous/anxious.      Objective: BP 120/70   Ht 5\' 4"  (1.626 m)   Wt 152 lb (68.9 kg)   LMP 02/26/2019 (Exact Date)   BMI 26.09 kg/m    Physical Exam Constitutional:      Appearance: She is well-developed.  Genitourinary:     Vulva, vagina, cervix, uterus, right adnexa, left adnexa and rectum normal.     No vulval lesion or tenderness noted.     No vaginal discharge, erythema or tenderness.     No cervical polyp.     Uterus is not enlarged or tender.     No right or left  adnexal mass present.     Right adnexa not tender.     Left adnexa not tender.  Rectum:     Guaiac result negative.     No rectal mass or anal fissure.  Neck:     Thyroid: No thyromegaly.  Cardiovascular:     Rate and Rhythm: Normal rate and regular rhythm.     Heart sounds: Normal heart sounds. No murmur.  Pulmonary:     Effort: Pulmonary effort is normal.     Breath sounds: Normal breath sounds.  Chest:     Breasts:        Right: No mass, nipple discharge, skin change or tenderness.  Left: No mass, nipple discharge, skin change or tenderness.  Abdominal:     Palpations: Abdomen is soft.     Tenderness: There is no abdominal tenderness. There is no guarding.  Musculoskeletal:        General: Normal range of motion.     Cervical back: Normal range of motion.  Neurological:     General: No focal deficit present.     Mental Status: She is alert and oriented to person, place, and time.     Cranial Nerves: No cranial nerve deficit.  Skin:    General: Skin is warm and dry.  Psychiatric:        Mood and Affect: Mood normal.        Behavior: Behavior normal.        Thought Content: Thought content normal.        Judgment: Judgment normal.  Vitals reviewed.     Assessment/Plan:  Encounter for annual routine gynecological examination  Encounter for screening mammogram for malignant neoplasm of breast - Plan: MM 3D SCREEN BREAST BILATERAL; pt current on mammo  Herpes simplex vulvovaginitis - Rx RF valtrex - Plan: valACYclovir (VALTREX) 500 MG tablet  Abnormal uterine bleeding (AUB) - Plan: US PELVIS TRANSVAGINAL NON-OB (TV ONLY); Sx intermittent for past 6 months; Check GYN u/s. Will f/u with results.   Postcoital bleeding--neg cx exam. Neg pap 2020. Check GYN u/s. Will f/u with results        Meds ordered this encounter  Medications  . valACYclovir (VALTREX) 500 MG tablet    Sig: TAKE 1 TABLET BY MOUTH 2 TIMES DAILY FOR 3 DAYS AS NEEDED FOR SYMPTOMS    Dispense:   30 tablet    Refill:  1    Order Specific Question:   Supervising Provider    Answer:   Nadara Mustard [[id number]]    GYN counsel menopause, adequate intake of calcium and vitamin D, diet and exercise    F/U  Return in about 1 day (around 03/19/2019) for GYN u/s for AUB--[facility] to call pt.  Karalyn Kadel B. Keeara Frees, PA-C 03/18/2019 9:33 AM

## 2019-03-18 ENCOUNTER — Other Ambulatory Visit: Payer: Self-pay

## 2019-03-18 ENCOUNTER — Ambulatory Visit (INDEPENDENT_AMBULATORY_CARE_PROVIDER_SITE_OTHER): Payer: BC Managed Care – PPO | Admitting: Obstetrics and Gynecology

## 2019-03-18 ENCOUNTER — Encounter: Payer: Self-pay | Admitting: Obstetrics and Gynecology

## 2019-03-18 VITALS — BP 120/70 | Ht 64.0 in | Wt 152.0 lb

## 2019-03-18 DIAGNOSIS — N939 Abnormal uterine and vaginal bleeding, unspecified: Secondary | ICD-10-CM | POA: Insufficient documentation

## 2019-03-18 DIAGNOSIS — N938 Other specified abnormal uterine and vaginal bleeding: Secondary | ICD-10-CM

## 2019-03-18 DIAGNOSIS — Z01411 Encounter for gynecological examination (general) (routine) with abnormal findings: Secondary | ICD-10-CM

## 2019-03-18 DIAGNOSIS — N930 Postcoital and contact bleeding: Secondary | ICD-10-CM | POA: Diagnosis not present

## 2019-03-18 DIAGNOSIS — Z01419 Encounter for gynecological examination (general) (routine) without abnormal findings: Secondary | ICD-10-CM

## 2019-03-18 DIAGNOSIS — A6004 Herpesviral vulvovaginitis: Secondary | ICD-10-CM

## 2019-03-18 DIAGNOSIS — Z1231 Encounter for screening mammogram for malignant neoplasm of breast: Secondary | ICD-10-CM

## 2019-03-18 MED ORDER — VALACYCLOVIR HCL 500 MG PO TABS: ORAL_TABLET | ORAL | 1 refills | Status: DC

## 2019-03-18 NOTE — Patient Instructions (Signed)
I value your feedback and entrusting us with your care. If you get a [facility] patient survey, I would appreciate you taking the time to let us know about your experience today. Thank you!  As of December 27, 2018, your lab results will be released to your MyChart immediately, before I even have a chance to see them. Please give me time to review them and contact you if there are any abnormalities. Thank you for your patience.   [facility]: [phone]  [facility]: [phone]  

## 2019-04-06 ENCOUNTER — Other Ambulatory Visit: Payer: Self-pay | Admitting: Obstetrics and Gynecology

## 2019-04-06 DIAGNOSIS — A6004 Herpesviral vulvovaginitis: Secondary | ICD-10-CM

## 2019-05-02 DIAGNOSIS — D2271 Melanocytic nevi of right lower limb, including hip: Secondary | ICD-10-CM | POA: Diagnosis not present

## 2019-05-02 DIAGNOSIS — D224 Melanocytic nevi of scalp and neck: Secondary | ICD-10-CM | POA: Diagnosis not present

## 2019-05-02 DIAGNOSIS — D485 Neoplasm of uncertain behavior of skin: Secondary | ICD-10-CM | POA: Diagnosis not present

## 2019-05-02 DIAGNOSIS — D2262 Melanocytic nevi of left upper limb, including shoulder: Secondary | ICD-10-CM | POA: Diagnosis not present

## 2019-05-02 DIAGNOSIS — D225 Melanocytic nevi of trunk: Secondary | ICD-10-CM | POA: Diagnosis not present

## 2019-05-02 DIAGNOSIS — D2261 Melanocytic nevi of right upper limb, including shoulder: Secondary | ICD-10-CM | POA: Diagnosis not present

## 2019-10-21 DIAGNOSIS — Z20828 Contact with and (suspected) exposure to other viral communicable diseases: Secondary | ICD-10-CM | POA: Diagnosis not present

## 2019-12-03 ENCOUNTER — Other Ambulatory Visit: Payer: Self-pay | Admitting: Obstetrics and Gynecology

## 2019-12-03 DIAGNOSIS — A6004 Herpesviral vulvovaginitis: Secondary | ICD-10-CM

## 2019-12-31 DIAGNOSIS — Z1152 Encounter for screening for COVID-19: Secondary | ICD-10-CM | POA: Diagnosis not present

## 2020-03-24 DIAGNOSIS — Z1231 Encounter for screening mammogram for malignant neoplasm of breast: Secondary | ICD-10-CM | POA: Diagnosis not present

## 2020-03-26 ENCOUNTER — Encounter: Payer: Self-pay | Admitting: Obstetrics and Gynecology

## 2020-04-06 NOTE — Progress Notes (Signed)
PCP: Karie Schwalbe, MD   Chief Complaint  Patient presents with  . Gynecologic Exam    No concerns    HPI:      Mercedes Daniels is a 55 y.o. G0P0000 who LMP was Patient's last menstrual period was 03/31/2020 (exact date)., presents today for her annual examination.  Her menses are irregular now, every 1-4 months, lasting 3-4 days.  Dysmenorrhea mild, improved with NSAIDs. Rare intermenstrual bleeding for a day. She does not have vasomotor sx.   Sex activity: single partner, contraception - vasectomy. No vag dryness/bleeding.  Last Pap: 03/14/18 Results were: no abnormalities /neg HPV DNA.  Hx of STDs: HSV, takes valtrex prn sx. Usually gets outbreaks before/after menses.  Needs RF  Last mammogram: 03/24/20 at [facility], Results were: normal--routine follow-up in 12 months, followed by Dr. Lemar Livings in past for stable LT breast cysts and breast pain.  There is a FH of breast cancer in her MGM, genetic testing not indicated. There is no FH of ovarian cancer. The patient does occas self-breast exams.  Colonoscopy: 2019 without abnormalities; repeat after 10 yrs. [facility].  Tobacco use: The patient denies current or previous tobacco use. Alcohol use: social drinker  No drug use Exercise: very active  She does get adequate calcium and Vitamin D in her diet. Normal fasting labs 2020  Past Medical History:  Diagnosis Date  . Breast cyst, left   . Genital herpes     Past Surgical History:  Procedure Laterality Date  . breast lift  2008   [place]  . COLONOSCOPY WITH PROPOFOL N/A 04/06/2017   Procedure: COLONOSCOPY WITH PROPOFOL;  Surgeon: Wyline Mood, MD;  Location: [facility] ENDOSCOPY;  Service: Gastroenterology;  Laterality: N/A;  . Cyst under tongue     As a child    Family History  Problem Relation Age of Onset  . Cancer Father        Prostate  . Breast cancer Maternal Grandmother 47  . Hypertension Neg Hx   . Heart disease Neg Hx   . Diabetes Neg Hx     Social  History   Socioeconomic History  . Marital status: Married    Spouse name: Not on file  . Number of children: 0  . Years of education: Not on file  . Highest education level: Not on file  Occupational History  . Occupation: HR Production designer, theatre/television/film    Comment: Nurse, mental health  Tobacco Use  . Smoking status: Never Smoker  . Smokeless tobacco: Never Used  Vaping Use  . Vaping Use: Never used  Substance and Sexual Activity  . Alcohol use: Yes    Alcohol/week: 2.0 standard drinks    Types: 2 Glasses of wine per week  . Drug use: No  . Sexual activity: Yes    Birth control/protection: None, Surgical    Comment: vasectomy   Other Topics Concern  . Not on file  Social History Narrative   Married, 2 step-children   Social Determinants of Health   Financial Resource Strain: Not on file  Food Insecurity: Not on file  Transportation Needs: Not on file  Physical Activity: Not on file  Stress: Not on file  Social Connections: Not on file  Intimate Partner Violence: Not on file    Current Meds  Medication Sig  . ibuprofen (ADVIL,MOTRIN) 200 MG tablet Take 200 mg by mouth every 6 (six) hours as needed.  . RESTASIS 0.05 % ophthalmic emulsion INSTILL 1 DROP INTO BOTH EYES TWICE  A DAY AS DIRECTED  . [DISCONTINUED] valACYclovir (VALTREX) 500 MG tablet TAKE 1 TABLET BY MOUTH 2 TIMES DAILY FOR 3 DAYS AS NEEDED FOR SYMPTOMS      ROS:  Review of Systems  Constitutional: Negative for fatigue, fever and unexpected weight change.  Respiratory: Negative for cough, shortness of breath and wheezing.   Cardiovascular: Negative for chest pain, palpitations and leg swelling.  Gastrointestinal: Negative for blood in stool, constipation, diarrhea, nausea and vomiting.  Endocrine: Negative for cold intolerance, heat intolerance and polyuria.  Genitourinary: Negative for dyspareunia, dysuria, flank pain, frequency, genital sores, hematuria, menstrual problem, pelvic pain, urgency, vaginal bleeding, vaginal  discharge and vaginal pain.  Musculoskeletal: Negative for back pain, joint swelling and myalgias.  Skin: Negative for rash.  Neurological: Negative for dizziness, syncope, light-headedness, numbness and headaches.  Hematological: Negative for adenopathy.  Psychiatric/Behavioral: Negative for agitation, confusion, sleep disturbance and suicidal ideas. The patient is not nervous/anxious.      Objective: BP 100/70   Ht 5\' 4"  (1.626 m)   Wt 144 lb (65.3 kg)   LMP 03/31/2020 (Exact Date)   BMI 24.72 kg/m    Physical Exam Constitutional:      Appearance: She is well-developed.  Genitourinary:     Vulva and rectum normal.     Right Labia: No rash, tenderness or lesions.    Left Labia: No tenderness, lesions or rash.    No vaginal discharge, erythema or tenderness.      Right Adnexa: not tender and no mass present.    Left Adnexa: not tender and no mass present.    No cervical friability or polyp.     Uterus is not enlarged or tender.  Rectum:     Guaiac result negative.     No rectal mass or anal fissure.  Breasts:     Right: No mass, nipple discharge, skin change or tenderness.     Left: No mass, nipple discharge, skin change or tenderness.    Neck:     Thyroid: No thyromegaly.  Cardiovascular:     Rate and Rhythm: Normal rate and regular rhythm.     Heart sounds: Normal heart sounds. No murmur heard.   Pulmonary:     Effort: Pulmonary effort is normal.     Breath sounds: Normal breath sounds.  Abdominal:     Palpations: Abdomen is soft.     Tenderness: There is no abdominal tenderness. There is no guarding or rebound.  Musculoskeletal:        General: Normal range of motion.     Cervical back: Normal range of motion.  Lymphadenopathy:     Cervical: No cervical adenopathy.  Neurological:     General: No focal deficit present.     Mental Status: She is alert and oriented to person, place, and time.     Cranial Nerves: No cranial nerve deficit.  Skin:     General: Skin is warm and dry.  Psychiatric:        Mood and Affect: Mood normal.        Behavior: Behavior normal.        Thought Content: Thought content normal.        Judgment: Judgment normal.  Vitals reviewed.     Assessment/Plan:  Encounter for annual routine gynecological examination  Encounter for screening mammogram for malignant neoplasm of breast; pt current on mammo  Herpes simplex vulvovaginitis - Plan: valACYclovir (VALTREX) 500 MG tablet; Rx RF.   Perimenopause--f/u prn AUB.  Meds ordered this encounter  Medications  . valACYclovir (VALTREX) 500 MG tablet    Sig: TAKE 1 TABLET BY MOUTH 2 TIMES DAILY FOR 3 DAYS AS NEEDED FOR SYMPTOMS    Dispense:  30 tablet    Refill:  1    Order Specific Question:   Supervising Provider    Answer:   Nadara Mustard [[id number]]    GYN counsel menopause, adequate intake of calcium and vitamin D, diet and exercise    F/U  Return in about 1 year (around 04/07/2021).  Sarit Sparano B. Ayshia Gramlich, PA-C 04/07/2020 9:32 AM

## 2020-04-07 ENCOUNTER — Ambulatory Visit (INDEPENDENT_AMBULATORY_CARE_PROVIDER_SITE_OTHER): Payer: BC Managed Care – PPO | Admitting: Obstetrics and Gynecology

## 2020-04-07 ENCOUNTER — Other Ambulatory Visit: Payer: Self-pay

## 2020-04-07 ENCOUNTER — Encounter: Payer: Self-pay | Admitting: Obstetrics and Gynecology

## 2020-04-07 VITALS — BP 100/70 | Ht 64.0 in | Wt 144.0 lb

## 2020-04-07 DIAGNOSIS — Z01419 Encounter for gynecological examination (general) (routine) without abnormal findings: Secondary | ICD-10-CM | POA: Diagnosis not present

## 2020-04-07 DIAGNOSIS — Z1231 Encounter for screening mammogram for malignant neoplasm of breast: Secondary | ICD-10-CM | POA: Diagnosis not present

## 2020-04-07 DIAGNOSIS — N951 Menopausal and female climacteric states: Secondary | ICD-10-CM | POA: Diagnosis not present

## 2020-04-07 DIAGNOSIS — A6004 Herpesviral vulvovaginitis: Secondary | ICD-10-CM | POA: Diagnosis not present

## 2020-04-07 MED ORDER — VALACYCLOVIR HCL 500 MG PO TABS: ORAL_TABLET | ORAL | 1 refills | Status: DC

## 2020-04-07 NOTE — Patient Instructions (Signed)
I value your feedback and you entrusting us with your care. If you get a [facility] patient survey, I would appreciate you taking the time to let us know about your experience today. Thank you! ? ? ?

## 2020-04-15 DIAGNOSIS — M7521 Bicipital tendinitis, right shoulder: Secondary | ICD-10-CM | POA: Diagnosis not present

## 2020-04-15 DIAGNOSIS — M25511 Pain in right shoulder: Secondary | ICD-10-CM | POA: Diagnosis not present

## 2020-04-19 ENCOUNTER — Other Ambulatory Visit: Payer: Self-pay | Admitting: Obstetrics and Gynecology

## 2020-04-19 DIAGNOSIS — A6004 Herpesviral vulvovaginitis: Secondary | ICD-10-CM

## 2020-04-24 ENCOUNTER — Telehealth: Payer: Self-pay

## 2020-04-24 DIAGNOSIS — R000 Tachycardia, unspecified: Secondary | ICD-10-CM

## 2020-04-24 NOTE — Telephone Encounter (Signed)
LMVM to notify [facility] out of office, but will be checking messages. She will place referral and patient should here from either our referral coordinator or the cardiology office regarding scheduling.

## 2020-04-24 NOTE — Telephone Encounter (Signed)
She had AE a few weeks ago w/[facility]. She had mentioned having a rapid heart rate. She's had a couple other episodes since. [facility] mentioned being referred to a cardiologist. Requesting referral. [phone]

## 2020-04-24 NOTE — Telephone Encounter (Signed)
Ref placed to cardio ?

## 2020-05-04 ENCOUNTER — Telehealth: Payer: Self-pay

## 2020-05-04 NOTE — Telephone Encounter (Signed)
[facility] [phone]

## 2020-05-04 NOTE — Telephone Encounter (Signed)
Pt calling; left msg on 4/7 about rapid heart rate; was told someone would get back to her about referral; as of today, has not heard from anyone.  [phone]

## 2020-05-05 NOTE — Telephone Encounter (Signed)
Pt aware.

## 2020-05-06 DIAGNOSIS — L570 Actinic keratosis: Secondary | ICD-10-CM | POA: Diagnosis not present

## 2020-05-06 DIAGNOSIS — D2272 Melanocytic nevi of left lower limb, including hip: Secondary | ICD-10-CM | POA: Diagnosis not present

## 2020-05-06 DIAGNOSIS — D225 Melanocytic nevi of trunk: Secondary | ICD-10-CM | POA: Diagnosis not present

## 2020-05-06 DIAGNOSIS — D2261 Melanocytic nevi of right upper limb, including shoulder: Secondary | ICD-10-CM | POA: Diagnosis not present

## 2020-05-06 DIAGNOSIS — D2262 Melanocytic nevi of left upper limb, including shoulder: Secondary | ICD-10-CM | POA: Diagnosis not present

## 2020-05-06 DIAGNOSIS — X32XXXA Exposure to sunlight, initial encounter: Secondary | ICD-10-CM | POA: Diagnosis not present

## 2020-07-08 ENCOUNTER — Other Ambulatory Visit: Payer: Self-pay

## 2020-07-08 ENCOUNTER — Ambulatory Visit: Payer: BC Managed Care – PPO | Admitting: Internal Medicine

## 2020-07-08 ENCOUNTER — Ambulatory Visit (INDEPENDENT_AMBULATORY_CARE_PROVIDER_SITE_OTHER): Payer: BC Managed Care – PPO

## 2020-07-08 ENCOUNTER — Encounter: Payer: Self-pay | Admitting: Internal Medicine

## 2020-07-08 VITALS — BP 106/80 | HR 66 | Ht 64.0 in | Wt 134.0 lb

## 2020-07-08 DIAGNOSIS — R0602 Shortness of breath: Secondary | ICD-10-CM

## 2020-07-08 DIAGNOSIS — R002 Palpitations: Secondary | ICD-10-CM | POA: Diagnosis not present

## 2020-07-08 DIAGNOSIS — R079 Chest pain, unspecified: Secondary | ICD-10-CM | POA: Diagnosis not present

## 2020-07-08 MED ORDER — ASPIRIN EC 81 MG PO TBEC: 81.0000 mg | DELAYED_RELEASE_TABLET | Freq: Every day | ORAL | Status: DC

## 2020-07-08 NOTE — Progress Notes (Signed)
New Outpatient Visit Date: 07/08/2020  Referring Provider: Rica Records, PA-C [id number] [place],  [place] [address]  Chief Complaint: Palpitations and chest tightness  HPI:  Mercedes Daniels is a 55 y.o. female who is being seen today for the evaluation of palpitations and chest tightness at the request of Ms. Copland. She has no significant past medical history.  Over the last few months, Mercedes Daniels has experienced intermittent palpitations during which it feels like her heart races.  This happens randomly, typically lasting less than 5 minutes.  At times, the palpitations are associated with tightness in the chest as well as shortness of breath.  She reports 1 profound episode that occurred in March while she was at church.  She her heart began to race was associated with chest tightness and profound weakness that made it difficult for her to stand.  A nurse at the service evaluated her and suggested that she see a cardiologist for further assessment.  Mercedes Daniels has not had such severe episodes since then though she continues to have palpitations about 4 days a week.  She also notes intermittent tightness in the chest that is not clearly associated with the aforementioned palpitations.  This can happen randomly and is not exertional.  She exercises every morning and denies chest discomfort and dyspnea with this.  The tightness is in the upper chest without radiation and can last minutes to hours.  Mercedes Daniels denies lightheadedness/syncope as well as edema.  She reports having undergone a stress test about 20 years ago and believes it was normal.  --------------------------------------------------------------------------------------------------  Cardiovascular History & Procedures: Cardiovascular Problems: Palpitations Chest tightness  Risk Factors: None  Cath/PCI: None  CV Surgery: None  EP Procedures and Devices: None  Non-Invasive Evaluation(s): Patient reports normal stress  test ~20 years ago.  Recent CV Pertinent Labs: Lab Results  Component Value Date   CHOL 203 (H) 03/14/2018   HDL 96 03/14/2018   LDLCALC 96 03/14/2018   TRIG 54 03/14/2018   CHOLHDL 2.1 03/14/2018   K 4.0 03/14/2018   BUN 16 03/14/2018   CREATININE 0.82 03/14/2018    --------------------------------------------------------------------------------------------------  Past Medical History:  Diagnosis Date   Breast cyst, left    Genital herpes     Past Surgical History:  Procedure Laterality Date   breast lift  2008   [place]   COLONOSCOPY WITH PROPOFOL N/A 04/06/2017   Procedure: COLONOSCOPY WITH PROPOFOL;  Surgeon: Wyline Mood, MD;  Location: [facility];  Service: Gastroenterology;  Laterality: N/A;   Cyst under tongue     As a child    Current Meds  Medication Sig   ibuprofen (ADVIL,MOTRIN) 200 MG tablet Take 200 mg by mouth every 6 (six) hours as needed.   RESTASIS 0.05 % ophthalmic emulsion INSTILL 1 DROP INTO BOTH EYES TWICE A DAY AS DIRECTED   valACYclovir (VALTREX) 500 MG tablet TAKE 1 TABLET BY MOUTH 2 TIMES DAILY FOR 3 DAYS AS NEEDED FOR SYMPTOMS    Allergies: Patient has no known allergies.  Social History   Tobacco Use   Smoking status: Never   Smokeless tobacco: Never  Vaping Use   Vaping Use: Never used  Substance Use Topics   Alcohol use: Yes    Alcohol/week: 2.0 standard drinks    Types: 2 Glasses of wine per week    Comment: 2-3 glasses of wine per week   Drug use: No    Family History  Problem Relation Age of Onset  Cancer Father        Prostate   Breast cancer Maternal Grandmother 105   Hypertension Neg Hx    Heart disease Neg Hx    Diabetes Neg Hx     Review of Systems: A 12-system review of systems was performed and was negative except as noted in the HPI.  --------------------------------------------------------------------------------------------------  Physical Exam: BP 106/80 (BP Location: Right Arm, Patient Position:  Sitting, Cuff Size: Normal)   Pulse 66   Ht 5\' 4"  (1.626 m)   Wt 134 lb (60.8 kg)   SpO2 98%   BMI 23.00 kg/m   General: NAD. HEENT: No conjunctival pallor or scleral icterus. Facemask in place. Neck: Supple without lymphadenopathy, thyromegaly, JVD, or HJR. No carotid bruit. Lungs: Normal work of breathing. Clear to auscultation bilaterally without wheezes or crackles. Heart: Regular rate and rhythm without murmurs, rubs, or gallops. Non-displaced PMI. Abd: Bowel sounds present. Soft, NT/ND without hepatosplenomegaly Ext: No lower extremity edema. Radial, PT, and DP pulses are 2+ bilaterally Skin: Warm and dry without rash. Neuro: CNIII-XII intact. Strength and fine-touch sensation intact in upper and lower extremities bilaterally. Psych: Normal mood and affect.  EKG: Normal sinus rhythm with poor R wave progression in V1 and V2.   Lab Results  Component Value Date   NA 138 03/14/2018   K 4.0 03/14/2018   CL 100 03/14/2018   CO2 24 03/14/2018   BUN 16 03/14/2018   CREATININE 0.82 03/14/2018   GLUCOSE 76 03/14/2018   ALT 14 03/14/2018    Lab Results  Component Value Date   CHOL 203 (H) 03/14/2018   HDL 96 03/14/2018   LDLCALC 96 03/14/2018   TRIG 54 03/14/2018   CHOLHDL 2.1 03/14/2018    --------------------------------------------------------------------------------------------------  ASSESSMENT AND PLAN: Palpitations: Symptoms have been present for the last few months and happen several times a week.  One severe episode led to marked weakness with accompanying chest discomfort and dyspnea.  EKG today shows poor R wave progression in V1 and V2 with normal sinus rhythm.  I have recommended that we check a CBC, CMP, and TSH today as well as obtain a 14-day event monitor for further characterization.  Chest tightness and shortness of breath The symptoms have been intermittently present for the last several months.  Dyspnea is typically brief though vague upper chest  tightness can sometimes last all day.  The symptoms are not exertional.  Physical exam today is unremarkable.  EKG shows poor R wave progression in V1 and V2, which could be seen with septal MI though I favor this representing lead placement.  She does not have any significant cardiac risk factors.  We have discussed further work-up in the ED given some chest tightness today, though Ms. Tedrick declines.  I have advised her to seek immediate medical attention should her symptoms worsen.  I will have her begin aspirin 81 mg daily.  We have discussed further options for evaluation, including risks and benefits of exercise stress testing, pharmacologic myocardial perfusion stress testing, coronary CTA, and cardiac catheterization.  We have agreed to proceed with exercise tolerance test.  Follow-up: Return to clinic in 4 to 6 weeks.  Darius Bump, MD 07/08/2020 11:46 AM

## 2020-07-08 NOTE — Patient Instructions (Signed)
Medication Instructions:   Your physician has recommended you make the following change in your medication:   START Aspirin EC 81mg  daily - you may purchase this over-the-counter  *If you need a refill on your cardiac medications before your next appointment, please call your pharmacy*   Lab Work:  Today: CBC, CMET, TSH  If you have labs (blood work) drawn today and your tests are completely normal, you will receive your results only by: MyChart Message (if you have MyChart) OR A paper copy in the mail If you have any lab test that is abnormal or we need to change your treatment, we will call you to review the results.   Testing/Procedures:  1) Your provider has recommended we schedule an Exercise Tolerance Test (Treadmill Stress Test)  This will be scheduled to be done in our office AFTER you wear your heart monitor for 2 weeks.  - you may eat a light breakfast/ lunch prior to your procedure - no caffeine for 24 hours prior to your test (coffee, tea, soft drinks, or chocolate)  - no smoking/ vaping for 4 hours prior to your test - you may take your regular medications the day of your test. - bring any inhalers with you to your test - wear comfortable clothing & tennis/ non-skid shoes to walk on the treadmill  2) Your physician has recommended that you wear a Zio XT monitor for TWO WEEKS.   This monitor is a medical device that records the heart's electrical activity. Doctors most often use these monitors to diagnose arrhythmias. Arrhythmias are problems with the speed or rhythm of the heartbeat. The monitor is a small device applied to your chest. You can wear one while you do your normal daily activities. While wearing this monitor if you have any symptoms to push the button and record what you felt. Once you have worn this monitor for the period of time provider prescribed (Usually 14 days), you will return the monitor device in the postage paid box. Once it is returned they will  download the data collected and provide with a report which the provider will then review and we will call you with those results. Important tips:  Avoid showering during the first 24 hours of wearing the monitor. Avoid excessive sweating to help maximize wear time. Do not submerge the device, no hot tubs, and no swimming pools. Keep any lotions or oils away from the patch. After 24 hours you may shower with the patch on. Take brief showers with your back facing the shower head.  Do not remove patch once it has been placed because that will interrupt data and decrease adhesive wear time. Push the button when you have any symptoms and write down what you were feeling. Once you have completed wearing your monitor, remove and place into box which has postage paid and place in your outgoing mailbox.  If for some reason you have misplaced your box then call our office and we can provide another box and/or mail it off for you.    Follow-Up: At [facility], you and your health needs are our priority.  As part of our continuing mission to provide you with exceptional heart care, we have created designated Provider Care Teams.  These Care Teams include your primary Cardiologist (physician) and Advanced Practice Providers (APPs -  Physician Assistants and Nurse Practitioners) who all work together to provide you with the care you need, when you need it.  We recommend signing up for  the patient portal called "MyChart".  Sign up information is provided on this After Visit Summary.  MyChart is used to connect with patients for Virtual Visits (Telemedicine).  Patients are able to view lab/test results, encounter notes, upcoming appointments, etc.  Non-urgent messages can be sent to your provider as well.   To learn more about what you can do with MyChart, go to [email or web address].    Your next appointment:   4 - 6 week(s)  The format for your next appointment:   In Person  Provider:   You may  see Yvonne Kendall, MD or one of the following Advanced Practice Providers on your designated Care Team:   Nicolasa Ducking, NP Eula Listen, PA-C Marisue Ivan, PA-C Cadence Wilsall, [place] Gillian Shields, NP

## 2020-07-09 LAB — TSH: TSH: 2.66 u[IU]/mL (ref 0.450–4.500)

## 2020-07-09 LAB — COMPREHENSIVE METABOLIC PANEL
ALT: 17 IU/L (ref 0–32)
AST: 22 IU/L (ref 0–40)
Albumin/Globulin Ratio: 1.7 (ref 1.2–2.2)
Albumin: 4.6 g/dL (ref 3.8–4.9)
Alkaline Phosphatase: 78 IU/L (ref 44–121)
BUN/Creatinine Ratio: 24 — ABNORMAL HIGH (ref 9–23)
BUN: 20 mg/dL (ref 6–24)
Bilirubin Total: 0.4 mg/dL (ref 0.0–1.2)
CO2: 25 mmol/L (ref 20–29)
Calcium: 9.6 mg/dL (ref 8.7–10.2)
Chloride: 98 mmol/L (ref 96–106)
Creatinine, Ser: 0.83 mg/dL (ref 0.57–1.00)
Globulin, Total: 2.7 g/dL (ref 1.5–4.5)
Glucose: 81 mg/dL (ref 65–99)
Potassium: 4.3 mmol/L (ref 3.5–5.2)
Sodium: 135 mmol/L (ref 134–144)
Total Protein: 7.3 g/dL (ref 6.0–8.5)
eGFR: 84 mL/min/{1.73_m2} (ref >59)

## 2020-07-09 LAB — CBC
Hematocrit: 44.8 % (ref 34.0–46.6)
Hemoglobin: 15.4 g/dL (ref 11.1–15.9)
MCH: 33 pg (ref 26.6–33.0)
MCHC: 34.4 g/dL (ref 31.5–35.7)
MCV: 96 fL (ref 79–97)
Platelets: 246 10*3/uL (ref 150–450)
RBC: 4.66 x10E6/uL (ref 3.77–5.28)
RDW: 11.7 % (ref 11.7–15.4)
WBC: 8.1 10*3/uL (ref 3.4–10.8)

## 2020-07-28 DIAGNOSIS — R0602 Shortness of breath: Secondary | ICD-10-CM | POA: Diagnosis not present

## 2020-07-28 DIAGNOSIS — R002 Palpitations: Secondary | ICD-10-CM | POA: Diagnosis not present

## 2020-07-28 DIAGNOSIS — R079 Chest pain, unspecified: Secondary | ICD-10-CM | POA: Diagnosis not present

## 2020-08-10 ENCOUNTER — Telehealth: Payer: Self-pay | Admitting: *Deleted

## 2020-08-10 NOTE — Telephone Encounter (Signed)
-----   Message from Bryna Colander, RN sent at 08/10/2020  4:07 PM EDT -----  ----- Message ----- From: Annia Belt, RN Sent: 08/10/2020   3:23 PM EDT To: Bryna Colander, RN  When you call pt for treadmill tomorrow would you please give her results as well. Thanks! ----- Message ----- From: Yvonne Kendall, MD Sent: 08/09/2020   8:20 PM EDT To: Annia Belt, RN  Please let Ms. Litke know that her event monitor showed a few extra beats but no significant arrhythmia.  She should continue her current medications.  We will follow-up with her after completion of the exercise tolerance test.

## 2020-08-10 NOTE — Telephone Encounter (Signed)
Attempted to call pt with event monitor results and to confirm ETT instructions for tomorrow.  No answer. Left detailed message with lab results and ETT instructions below. DPR approved to LDM.   Confirmed pt does not need to hold any medications prior to ETT.  Scheduled 08/11/20 at 9:00 AM. Attestation form has been completed. Asked pt on vm to call our office with any further questions.    Exercise Tolerance Test (Treadmill Stress Test)   - you may eat a light breakfast/ lunch prior to your procedure - no caffeine for 24 hours prior to your test (coffee, tea, soft drinks, or chocolate) - no smoking/ vaping for 4 hours prior to your test - you may take your regular medications the day of your test. - bring any inhalers with you to your test - wear comfortable clothing & tennis/ non-skid shoes to walk on the treadmill

## 2020-08-11 ENCOUNTER — Ambulatory Visit (INDEPENDENT_AMBULATORY_CARE_PROVIDER_SITE_OTHER): Payer: BC Managed Care – PPO

## 2020-08-11 ENCOUNTER — Other Ambulatory Visit: Payer: Self-pay

## 2020-08-11 DIAGNOSIS — R0602 Shortness of breath: Secondary | ICD-10-CM | POA: Diagnosis not present

## 2020-08-11 DIAGNOSIS — R002 Palpitations: Secondary | ICD-10-CM | POA: Diagnosis not present

## 2020-08-11 DIAGNOSIS — R079 Chest pain, unspecified: Secondary | ICD-10-CM | POA: Diagnosis not present

## 2020-08-11 LAB — EXERCISE TOLERANCE TEST
Estimated workload: 10.2 METS
Exercise duration (min): 9 min
Exercise duration (sec): 6 s
MPHR: 146 {beats}/min
Peak HR: 146 {beats}/min
Percent HR: 87 %
Rest HR: 76 {beats}/min

## 2020-08-13 ENCOUNTER — Ambulatory Visit: Payer: BC Managed Care – PPO | Admitting: Internal Medicine

## 2020-08-13 ENCOUNTER — Encounter: Payer: Self-pay | Admitting: Internal Medicine

## 2020-08-13 ENCOUNTER — Other Ambulatory Visit: Payer: Self-pay

## 2020-08-13 VITALS — BP 110/80 | HR 65 | Ht 64.0 in | Wt 136.0 lb

## 2020-08-13 DIAGNOSIS — R002 Palpitations: Secondary | ICD-10-CM | POA: Insufficient documentation

## 2020-08-13 DIAGNOSIS — R0789 Other chest pain: Secondary | ICD-10-CM | POA: Insufficient documentation

## 2020-08-13 NOTE — Progress Notes (Signed)
 Follow-up Outpatient Visit Date: 08/13/2020  Primary Care Provider: Letvak, Richard I, MD [address] [place] [address]  Chief Complaint: Follow-up palpitations and chest tightness  HPI:  [name] is a 54 y.o. female with no significant past medical history, who presents for follow-up of palpitations and chest tightness.  I met her last month, at which time she complained of palpitations over the last few months with associated chest tightness and dyspnea.  Subsequent event monitor showed predominantly sinus rhythm with rare PACs and PVCs as well as a single brief episode of PSVT (8 beats).  Exercise tolerance earlier this week was severely limited by motion artifact, there are no obvious ischemic changes or angina were noted.  Today, [name] reports that she is feeling well.  She notes a couple of palpitations as well as chest tightness (not related) shortly after our prior visit.  However, she has been asymptomatic for the last few weeks.  She felt well without chest pain or shortness of breath during her exercise tolerance test this week.  She denies dyspnea, lightheadedness, and edema as well.  --------------------------------------------------------------------------------------------------  Cardiovascular History & Procedures: Cardiovascular Problems: Palpitations Chest tightness   Risk Factors: None   Cath/PCI: None   CV Surgery: None   EP Procedures and Devices: 14-day event monitor (07/08/2020): Predominantly sinus rhythm with rare PACs and PVCs as well as a single brief episode of PSVT (8 beats).   Non-Invasive Evaluation(s): Exercise tolerance test (08/11/2020): Probably low risk exercise tolerance test, the sensitivity and specificity of the study are significantly degraded by motion artifact during stress.  Recent CV Pertinent Labs: Lab Results  Component Value Date   CHOL 203 (H) 03/14/2018   HDL 96 03/14/2018   LDLCALC 96 03/14/2018   TRIG 54  03/14/2018   CHOLHDL 2.1 03/14/2018   K 4.3 07/08/2020   BUN 20 07/08/2020   CREATININE 0.83 07/08/2020    Past medical and surgical history were reviewed and updated in EPIC.  Current Meds  Medication Sig   aspirin EC 81 MG tablet Take 1 tablet (81 mg total) by mouth daily. Swallow whole.   ibuprofen (ADVIL,MOTRIN) 200 MG tablet Take 200 mg by mouth every 6 (six) hours as needed.   meloxicam (MOBIC) 7.5 MG tablet Take 7.5 mg by mouth 2 (two) times daily.   RESTASIS 0.05 % ophthalmic emulsion INSTILL 1 DROP INTO BOTH EYES TWICE A DAY AS DIRECTED   valACYclovir (VALTREX) 500 MG tablet TAKE 1 TABLET BY MOUTH 2 TIMES DAILY FOR 3 DAYS AS NEEDED FOR SYMPTOMS    Allergies: Patient has no known allergies.  Social History   Tobacco Use   Smoking status: Never   Smokeless tobacco: Never  Vaping Use   Vaping Use: Never used  Substance Use Topics   Alcohol use: Yes    Alcohol/week: 3.0 standard drinks    Types: 3 Glasses of wine per week    Comment: weekly   Drug use: No    Family History  Problem Relation Age of Onset   Cancer Father        CLL   Breast cancer Maternal Grandmother 70   Hypertension Neg Hx    Heart disease Neg Hx    Diabetes Neg Hx     Review of Systems: A 12-system review of systems was performed and was negative except as noted in the HPI.  --------------------------------------------------------------------------------------------------  Physical Exam: BP 110/80 (BP Location: Left Arm, Patient Position: Sitting, Cuff Size: Normal)     Pulse 65   Ht 5' 4" (1.626 m)   Wt 136 lb (61.7 kg)   SpO2 98%   BMI 23.34 kg/m   General:  NAD. Neck: No JVD or HJR. Lungs: Clear to auscultation bilaterally without wheezes or crackles. Heart: Regular rate and rhythm without murmurs, rubs, or gallops. Abdomen: Soft, nontender, nondistended. Extremities: No lower extremity edema.   Lab Results  Component Value Date   WBC 8.1 07/08/2020   HGB 15.4 07/08/2020    HCT 44.8 07/08/2020   MCV 96 07/08/2020   PLT 246 07/08/2020    Lab Results  Component Value Date   NA 135 07/08/2020   K 4.3 07/08/2020   CL 98 07/08/2020   CO2 25 07/08/2020   BUN 20 07/08/2020   CREATININE 0.83 07/08/2020   GLUCOSE 81 07/08/2020   ALT 17 07/08/2020    Lab Results  Component Value Date   CHOL 203 (H) 03/14/2018   HDL 96 03/14/2018   LDLCALC 96 03/14/2018   TRIG 54 03/14/2018   CHOLHDL 2.1 03/14/2018    --------------------------------------------------------------------------------------------------  ASSESSMENT AND PLAN: Palpitations: Palpitations have resolved over the last few weeks.  Event monitor showed rare PAC's and PVC's, as well as one brief atrial run (asymptomatic).  No further intervention is recommended at this time unless symptoms recur.  Chest tightness: No pain reported over the last few weeks, though [name] had occasional discomfort shortly after our last visit.  Exercise tolerance test was difficult to interpret due to motion artifact, though no obvious ischemic changes were seen.  Given equivocal results, we discussed further evaluation with coronary CTA versus watchful waiting to see if any further symptoms occur, given recent resolution of chest pain and paucity of cardiac risk factors.  [name] would like to think about these options and will reach out to us if she wishes to move forward with the coronary CTA.  Pending this decision, we will continue low-dose aspirin.  Follow-up: Return to clinic in 3 months.  Christopher End, MD 08/13/2020 1:15 PM  

## 2020-08-13 NOTE — Patient Instructions (Signed)
Medication Instructions:  Your physician recommends that you continue on your current medications as directed. Please refer to the Current Medication list given to you today.  *If you need a refill on your cardiac medications before your next appointment, please call your pharmacy*   Lab Work: None ordered If you have labs (blood work) drawn today and your tests are completely normal, you will receive your results only by: MyChart Message (if you have MyChart) OR A paper copy in the mail If you have any lab test that is abnormal or we need to change your treatment, we will call you to review the results.   Testing/Procedures: None ordered  Let us know if you would like to proceed with Coronary CTA  Follow-Up: At [facility], you and your health needs are our priority.  As part of our continuing mission to provide you with exceptional heart care, we have created designated Provider Care Teams.  These Care Teams include your primary Cardiologist (physician) and Advanced Practice Providers (APPs -  Physician Assistants and Nurse Practitioners) who all work together to provide you with the care you need, when you need it.  We recommend signing up for the patient portal called "MyChart".  Sign up information is provided on this After Visit Summary.  MyChart is used to connect with patients for Virtual Visits (Telemedicine).  Patients are able to view lab/test results, encounter notes, upcoming appointments, etc.  Non-urgent messages can be sent to your provider as well.   To learn more about what you can do with MyChart, go to [email or web address].    Your next appointment:   3 month(s)  The format for your next appointment:   In Person  Provider:   You may see Yvonne Kendall, MD or one of the following Advanced Practice Providers on your designated Care Team:   Nicolasa Ducking, NP Eula Listen, PA-C Marisue Ivan, PA-C Cadence Fransico Michael, [place]   Other Instructions  Cardiac CT  Angiogram A cardiac CT angiogram is a procedure to look at the heart and the area around the heart. It may be done to help find the cause of chest pains or other symptoms of heart disease. During this procedure, a substance called contrast dye is injected into the blood vessels in the area to be checked. A large X-ray machine, called a CT scanner, then takes detailed pictures of the heart and the surrounding area. The procedure is also sometimes called a coronary CTangiogram, coronary artery scanning, or CTA. A cardiac CT angiogram allows the health care provider to see how well blood is flowing to and from the heart. The health care provider will be able to see if there are any problems, such as: Blockage or narrowing of the coronary arteries in the heart. Fluid around the heart. Signs of weakness or disease in the muscles, valves, and tissues of the heart. Tell a health care provider about: Any allergies you have. This is especially important if you have had a previous allergic reaction to contrast dye. All medicines you are taking, including vitamins, herbs, eye drops, creams, and over-the-counter medicines. Any blood disorders you have. Any surgeries you have had. Any medical conditions you have. Whether you are pregnant or may be pregnant. Any anxiety disorders, chronic pain, or other conditions you have that may increase your stress or prevent you from lying still. What are the risks? Generally, this is a safe procedure. However, problems may occur, including: Bleeding. Infection. Allergic reactions to medicines or dyes. Damage to other  structures or organs. Kidney damage from the contrast dye that is used. Increased risk of cancer from radiation exposure. This risk is low. Talk with your health care provider about: The risks and benefits of testing. How you can receive the lowest dose of radiation. What happens before the procedure? Wear comfortable clothing and remove any jewelry,  glasses, dentures, and hearing aids. Follow instructions from your health care provider about eating and drinking. This may include: For 12 hours before the procedure -- avoid caffeine. This includes tea, coffee, soda, energy drinks, and diet pills. Drink plenty of water or other fluids that do not have caffeine in them. Being well hydrated can prevent complications. For 4-6 hours before the procedure -- stop eating and drinking. The contrast dye can cause nausea, but this is less likely if your stomach is empty. Ask your health care provider about changing or stopping your regular medicines. This is especially important if you are taking diabetes medicines, blood thinners, or medicines to treat problems with erections (erectile dysfunction). What happens during the procedure?  Hair on your chest may need to be removed so that small sticky patches called electrodes can be placed on your chest. These will transmit information that helps to monitor your heart during the procedure. An IV will be inserted into one of your veins. You might be given a medicine to control your heart rate during the procedure. This will help to ensure that good images are obtained. You will be asked to lie on an exam table. This table will slide in and out of the CT machine during the procedure. Contrast dye will be injected into the IV. You might feel warm, or you may get a metallic taste in your mouth. You will be given a medicine called nitroglycerin. This will relax or dilate the arteries in your heart. The table that you are lying on will move into the CT machine tunnel for the scan. The person running the machine will give you instructions while the scans are being done. You may be asked to: Keep your arms above your head. Hold your breath. Stay very still, even if the table is moving. When the scanning is complete, you will be moved out of the machine. The IV will be removed. The procedure may vary among health  care providers and hospitals. What can I expect after the procedure? After your procedure, it is common to have: A metallic taste in your mouth from the contrast dye. A feeling of warmth. A headache from the nitroglycerin. Follow these instructions at home: Take over-the-counter and prescription medicines only as told by your health care provider. If you are told, drink enough fluid to keep your urine pale yellow. This will help to flush the contrast dye out of your body. Most people can return to their normal activities right after the procedure. Ask your health care provider what activities are safe for you. It is up to you to get the results of your procedure. Ask your health care provider, or the department that is doing the procedure, when your results will be ready. Keep all follow-up visits as told by your health care provider. This is important. Contact a health care provider if: You have any symptoms of allergy to the contrast dye. These include: Shortness of breath. Rash or hives. A racing heartbeat. Summary A cardiac CT angiogram is a procedure to look at the heart and the area around the heart. It may be done to help find the cause of  chest pains or other symptoms of heart disease. During this procedure, a large X-ray machine, called a CT scanner, takes detailed pictures of the heart and the surrounding area after a contrast dye has been injected into blood vessels in the area. Ask your health care provider about changing or stopping your regular medicines before the procedure. This is especially important if you are taking diabetes medicines, blood thinners, or medicines to treat erectile dysfunction. If you are told, drink enough fluid to keep your urine pale yellow. This will help to flush the contrast dye out of your body. This information is not intended to replace advice given to you by your health care provider. Make sure you discuss any questions you have with your  healthcare provider. Document Revised: 08/29/2018 Document Reviewed: 08/29/2018 Elsevier Patient Education  2022 [employer].

## 2020-09-03 ENCOUNTER — Telehealth: Payer: Self-pay | Admitting: Internal Medicine

## 2020-09-03 DIAGNOSIS — R072 Precordial pain: Secondary | ICD-10-CM

## 2020-09-03 NOTE — Telephone Encounter (Signed)
Patient calling to have cta ordered per last avs.

## 2020-09-04 NOTE — Telephone Encounter (Signed)
Spoke to pt, and she would like to move forward with scheduling coronary CTA.  Procedure was discussed by Dr. Okey Dupre at last office visit 08/13/20. Pt has been scheduled for CCTA at [facility] 09/17/20 at 2:30 PM. Discussed CCTA instructions below with pt on the phone and notified pt that I will also post to her MyChart.  Pt voiced understanding and has no further questions at this time.  Pt does ask that we wait to send Rx for pre-procedure Lopressor until next Friday 8/26 as she will be out of town all week and does not want it getting put on hold at the pharmacy.  Precert and Rivka Safer made aware as well.     Your cardiac CT has been scheduled Thursday 09/17/20 at 2:30 PM at:  [facility] [place] [address] [place], [place] [address] [phone]  Please arrive 15 mins early for check-in and test prep.  Please follow these instructions carefully (unless otherwise directed):   On the Night Before the Test: Be sure to Drink plenty of water. Do not consume any caffeinated/decaffeinated beverages or chocolate 12 hours prior to your test. Do not take any antihistamines 12 hours prior to your test.   On the Day of the Test: Drink plenty of water until 1 hour prior to the test. Do not eat any food 4 hours prior to the test. You may take your regular medications prior to the test.  Take metoprolol (Lopressor) two hours prior to test. HOLD Furosemide/Hydrochlorothiazide morning of the test. FEMALES- please wear underwire-free bra if available, avoid dresses & tight clothing       After the Test: Drink plenty of water. After receiving IV contrast, you may experience a mild flushed feeling. This is normal. On occasion, you may experience a mild rash up to 24 hours after the test. This is not dangerous. If this occurs, you can take Benadryl 25 mg and increase your fluid intake. If you experience trouble breathing, this can be serious. If it is  severe call 911 IMMEDIATELY. If it is mild, please call our office.   For non-scheduling related questions, please contact the cardiac imaging nurse navigator should you have any questions/concerns: Rockwell Alexandria, Cardiac Imaging Nurse Navigator Larey Brick, Cardiac Imaging Nurse Navigator [facility] Direct Office Dial: [phone]   For scheduling needs, including cancellations and rescheduling, please call [place], [phone].

## 2020-09-11 MED ORDER — METOPROLOL TARTRATE 100 MG PO TABS: 100.0000 mg | ORAL_TABLET | Freq: Once | ORAL | 0 refills | Status: DC

## 2020-09-11 NOTE — Telephone Encounter (Signed)
Rx for Lopressor 100 mg has been sent to [facility] [address]

## 2020-09-11 NOTE — Addendum Note (Signed)
Addended by: Lanny Hurst on: 09/11/2020 08:14 AM   Modules accepted: Orders

## 2020-09-16 ENCOUNTER — Telehealth (HOSPITAL_COMMUNITY): Payer: Self-pay | Admitting: Emergency Medicine

## 2020-09-16 NOTE — Telephone Encounter (Signed)
Reaching out to patient to offer assistance regarding upcoming cardiac imaging study; pt verbalizes understanding of appt date/time, parking situation and where to check in, pre-test NPO status and medications ordered, and verified current allergies; name and call back number provided for further questions should they arise Rockwell Alexandria RN Navigator Cardiac Imaging [facility] [phone] office [phone] cell  100mg  metop Denies iv issues Denies claustro

## 2020-09-17 ENCOUNTER — Ambulatory Visit
Admission: RE | Admit: 2020-09-17 | Discharge: 2020-09-17 | Disposition: A | Discharge status: Home / Self Care | Payer: BC Managed Care – PPO | Source: Ambulatory Visit | Attending: Internal Medicine | Admitting: Internal Medicine

## 2020-09-17 ENCOUNTER — Other Ambulatory Visit: Payer: Self-pay

## 2020-09-17 DIAGNOSIS — R072 Precordial pain: Secondary | ICD-10-CM | POA: Diagnosis not present

## 2020-09-17 MED ORDER — IOHEXOL 350 MG/ML SOLN
75.0000 mL | Freq: Once | INTRAVENOUS | Status: AC | PRN
  Administered 2020-09-17: 75 mL via INTRAVENOUS

## 2020-09-17 MED ORDER — SODIUM CHLORIDE 0.9 % IV BOLUS
250.0000 mL | Freq: Once | INTRAVENOUS | Status: AC
  Administered 2020-09-17: 250 mL via INTRAVENOUS

## 2020-09-17 MED ORDER — NITROGLYCERIN 0.4 MG SL SUBL
0.8000 mg | SUBLINGUAL_TABLET | Freq: Once | SUBLINGUAL | Status: AC
  Administered 2020-09-17: 0.8 mg via SUBLINGUAL

## 2020-09-17 NOTE — Progress Notes (Addendum)
After Cardiac CT patient's blood pressure slightly decreased and asymptomatic see vital signs. Patient drank water and was administered 0.9 NS 250 ml bolus. Blood pressure increased ambulated in hallway steady gait and has no complaints. Encourage to drink water throughout day.Reasons explained and verbalized understanding. Ambulated steady gait to exit.

## 2020-09-18 ENCOUNTER — Telehealth: Payer: Self-pay | Admitting: *Deleted

## 2020-09-18 NOTE — Telephone Encounter (Signed)
-----   Message from Yvonne Kendall, MD sent at 09/18/2020 10:41 AM EDT ----- Normal coronary arteries noted without CAD.  Incidental note made of tiny pulmonary nodules and bronchiectasis.  Given no history of smoking, no further work-up is recommended.  If she does not have any further symptoms, I think it would be fine for her to follow-up on an as-needed basis.  If she has new symptoms or other questions that come up, she can follow-up as previously arranged in November.

## 2020-09-18 NOTE — Telephone Encounter (Signed)
Attempted to call pt. No answer. Lmtcb.  

## 2020-09-22 NOTE — Telephone Encounter (Signed)
The patient has been notified of the result and verbalized understanding.    

## 2020-09-22 NOTE — Telephone Encounter (Signed)
Patient calling for results.

## 2020-09-23 NOTE — Telephone Encounter (Signed)
Called pt regarding further questions she had on coronary CTA results.  No answer at this time. Lmtcb.

## 2020-09-25 NOTE — Telephone Encounter (Signed)
Spoke with pt. All questions were answered.  Pt appreciative and has no further questions/concerns.

## 2020-10-27 ENCOUNTER — Encounter: Payer: Self-pay | Admitting: Obstetrics and Gynecology

## 2020-10-27 ENCOUNTER — Ambulatory Visit: Payer: BC Managed Care – PPO | Admitting: Obstetrics and Gynecology

## 2020-10-27 ENCOUNTER — Other Ambulatory Visit: Payer: Self-pay

## 2020-10-27 VITALS — BP 110/80 | Ht 64.0 in | Wt 140.0 lb

## 2020-10-27 DIAGNOSIS — N939 Abnormal uterine and vaginal bleeding, unspecified: Secondary | ICD-10-CM

## 2020-10-27 MED ORDER — NORETHINDRONE ACETATE 5 MG PO TABS: 5.0000 mg | ORAL_TABLET | Freq: Every day | ORAL | 0 refills | Status: DC

## 2020-10-27 NOTE — Progress Notes (Signed)
Mercedes Schwalbe, MD   Chief Complaint  Patient presents with   Vaginal Bleeding    For the past 4 weeks, no abnormal pain    HPI:      [name] is a 55 y.o. G0P0000 whose LMP was Patient's last menstrual period was 09/29/2020 (approximate)., presents today for AUB this past month. Her menses are irregular due to perimenopause, every 1-4 months, lasting 3-4 days.  Dysmenorrhea mild, improved with NSAIDs. Rare intermenstrual bleeding for a day. Previous LMP 5/22 was normal 4 days. Started bleeding 8/22 at end of month and bleeding hasn't stopped. Changing products BID on heaviest days, some days with brown d/c or dark brown blood. Has some clots, quarter sized occas. No dysmen/pelvic pain. She does not have vasomotor sx. Normal TSH 6/22 Neg pap 03/14/18; last annual 3/22 She is sex active, no pain/bleeding.   Past Medical History:  Diagnosis Date   Breast cyst, left    Genital herpes     Past Surgical History:  Procedure Laterality Date   breast lift  2008   [place]   COLONOSCOPY WITH PROPOFOL N/A 04/06/2017   Procedure: COLONOSCOPY WITH PROPOFOL;  Surgeon: Wyline Mood, MD;  Location: [facility] ENDOSCOPY;  Service: Gastroenterology;  Laterality: N/A;   Cyst under tongue     As a child    Family History  Problem Relation Age of Onset   Cancer Father        CLL   Breast cancer Maternal Grandmother 76   Hypertension Neg Hx    Heart disease Neg Hx    Diabetes Neg Hx     Social History   Socioeconomic History   Marital status: Married    Spouse name: Not on file   Number of children: 0   Years of education: Not on file   Highest education level: Not on file  Occupational History   Occupation: HR Production designer, theatre/television/film    Comment: Nurse, mental health  Tobacco Use   Smoking status: Never   Smokeless tobacco: Never  Vaping Use   Vaping Use: Never used  Substance and Sexual Activity   Alcohol use: Yes    Alcohol/week: 3.0 standard drinks    Types: 3 Glasses of wine per week     Comment: weekly   Drug use: No   Sexual activity: Yes    Birth control/protection: None, Surgical    Comment: vasectomy   Other Topics Concern   Not on file  Social History Narrative   Married, 2 step-children   Social Determinants of Health   Financial Resource Strain: Not on file  Food Insecurity: Not on file  Transportation Needs: Not on file  Physical Activity: Not on file  Stress: Not on file  Social Connections: Not on file  Intimate Partner Violence: Not on file    Outpatient Medications Prior to Visit  Medication Sig Dispense Refill   aspirin EC 81 MG tablet Take 1 tablet (81 mg total) by mouth daily. Swallow whole.     ibuprofen (ADVIL,MOTRIN) 200 MG tablet Take 200 mg by mouth every 6 (six) hours as needed.     RESTASIS 0.05 % ophthalmic emulsion INSTILL 1 DROP INTO BOTH EYES TWICE A DAY AS DIRECTED     valACYclovir (VALTREX) 500 MG tablet TAKE 1 TABLET BY MOUTH 2 TIMES DAILY FOR 3 DAYS AS NEEDED FOR SYMPTOMS 30 tablet 1   meloxicam (MOBIC) 7.5 MG tablet Take 7.5 mg by mouth 2 (two) times daily. (Patient not  taking: Reported on 10/27/2020)     metoprolol tartrate (LOPRESSOR) 100 MG tablet Take 1 tablet (100 mg total) by mouth once for 1 dose. Take TWO hours prior to CT procedure 1 tablet 0   No facility-administered medications prior to visit.      ROS:  Review of Systems  Constitutional:  Negative for fever.  Gastrointestinal:  Negative for blood in stool, constipation, diarrhea, nausea and vomiting.  Genitourinary:  Positive for vaginal bleeding. Negative for dyspareunia, dysuria, flank pain, frequency, hematuria, urgency, vaginal discharge and vaginal pain.  Musculoskeletal:  Negative for back pain.  Skin:  Negative for rash.  BREAST: No symptoms   OBJECTIVE:   Vitals:  BP 110/80   Ht 5\' 4"  (1.626 m)   Wt 140 lb (63.5 kg)   LMP 09/29/2020 (Approximate)   BMI 24.03 kg/m   Physical Exam Vitals reviewed.  Constitutional:      Appearance: She is  well-developed.  Pulmonary:     Effort: Pulmonary effort is normal.  Genitourinary:    General: Normal vulva.     Pubic Area: No rash.      Labia:        Right: No rash, tenderness or lesion.        Left: No rash, tenderness or lesion.      Vagina: Bleeding present. No vaginal discharge, erythema or tenderness.     Cervix: Normal.     Uterus: Normal. Not enlarged and not tender.      Adnexa: Right adnexa normal and left adnexa normal.       Right: No mass or tenderness.         Left: No mass or tenderness.       Comments: BROWN VAG D/C Musculoskeletal:        General: Normal range of motion.     Cervical back: Normal range of motion.  Skin:    General: Skin is warm and dry.  Neurological:     General: No focal deficit present.     Mental Status: She is alert and oriented to person, place, and time.  Psychiatric:        Mood and Affect: Mood normal.        Behavior: Behavior normal.        Thought Content: Thought content normal.        Judgment: Judgment normal.    Assessment/Plan: Abnormal uterine bleeding (AUB) - Plan: norethindrone (AYGESTIN) 5 MG tablet; for 1 episode. Rx aygestin to stop bleeding. Follow cycles. If sx resume, will check GYN u/s. If sx resolve, f/u prn AUB.    Meds ordered this encounter  Medications   norethindrone (AYGESTIN) 5 MG tablet    Sig: Take 1 tablet (5 mg total) by mouth daily for 7 days.    Dispense:  7 tablet    Refill:  0    Order Specific Question:   Supervising Provider    Answer:   10/01/2020 Nadara Mustard       Return if symptoms worsen or fail to improve.  Saydi Kobel B. Rosamaria Donn, PA-C 10/27/2020 3:51 PM

## 2020-11-13 ENCOUNTER — Ambulatory Visit: Payer: BC Managed Care – PPO | Admitting: Medical

## 2020-11-20 ENCOUNTER — Ambulatory Visit: Payer: BC Managed Care – PPO | Admitting: Internal Medicine

## 2021-01-07 DIAGNOSIS — J340 Abscess, furuncle and carbuncle of nose: Secondary | ICD-10-CM | POA: Diagnosis not present

## 2021-03-22 DIAGNOSIS — M7061 Trochanteric bursitis, right hip: Secondary | ICD-10-CM | POA: Diagnosis not present

## 2021-03-22 DIAGNOSIS — M65811 Other synovitis and tenosynovitis, right shoulder: Secondary | ICD-10-CM | POA: Diagnosis not present

## 2021-03-31 DIAGNOSIS — M25511 Pain in right shoulder: Secondary | ICD-10-CM | POA: Diagnosis not present

## 2021-04-02 DIAGNOSIS — Z1231 Encounter for screening mammogram for malignant neoplasm of breast: Secondary | ICD-10-CM | POA: Diagnosis not present

## 2021-04-05 ENCOUNTER — Encounter: Payer: Self-pay | Admitting: Obstetrics and Gynecology

## 2021-04-07 DIAGNOSIS — M25511 Pain in right shoulder: Secondary | ICD-10-CM | POA: Diagnosis not present

## 2021-04-13 DIAGNOSIS — M25511 Pain in right shoulder: Secondary | ICD-10-CM | POA: Diagnosis not present

## 2021-04-21 DIAGNOSIS — M25511 Pain in right shoulder: Secondary | ICD-10-CM | POA: Diagnosis not present

## 2021-04-28 DIAGNOSIS — M25511 Pain in right shoulder: Secondary | ICD-10-CM | POA: Diagnosis not present

## 2021-05-06 DIAGNOSIS — D2261 Melanocytic nevi of right upper limb, including shoulder: Secondary | ICD-10-CM | POA: Diagnosis not present

## 2021-05-06 DIAGNOSIS — L821 Other seborrheic keratosis: Secondary | ICD-10-CM | POA: Diagnosis not present

## 2021-05-06 DIAGNOSIS — D225 Melanocytic nevi of trunk: Secondary | ICD-10-CM | POA: Diagnosis not present

## 2021-05-06 DIAGNOSIS — D2262 Melanocytic nevi of left upper limb, including shoulder: Secondary | ICD-10-CM | POA: Diagnosis not present

## 2021-05-18 NOTE — Progress Notes (Signed)
? ?PCP: Venia Carbon, MD ? ? ?Chief Complaint  ?Patient presents with  ? Gynecologic Exam  ?  No concerns  ? ? ?HPI: ?     [name] is a 56 y.o. G0P0000 who LMP was Patient's last menstrual period was 10/19/2020 (approximate)., presents today for her annual examination.  Her menses are irregular due to perimenopause. Had AUB 10/22, given aygestin for 1 cycle; no bleeding/spotting since. No vasomotor sx.  ? ?Sex activity: single partner, contraception - vasectomy. No vag dryness/bleeding. Occas pain but pt not sure if vaginal or pelvic. Will monitor and f/u prn.  ? ?Last Pap: 03/14/18 Results were: no abnormalities /neg HPV DNA.  ?Hx of STDs: HSV, takes valtrex prn sx. Used to get outbreaks before/after menses, but rare now.  Needs RF ? ?Last mammogram: 04/02/21 at [facility], Results were: normal--routine follow-up in 12 months, followed by Dr. Bary Castilla in past for stable LT breast cysts and breast pain. ? ?There is a FH of breast cancer in her MGM, genetic testing not indicated. There is no FH of ovarian cancer. The patient does occas self-breast exams. ? ?Colonoscopy: 2019 without abnormalities; repeat after 10 yrs. [facility]. ? ?Tobacco use: The patient denies current or previous tobacco use. ?Alcohol use: social drinker  ?No drug use ?Exercise: very active ? ?She does get adequate calcium and Vitamin D in her diet. ?Normal fasting labs 2020; due for labs this yr ? ?Past Medical History:  ?Diagnosis Date  ? Breast cyst, left   ? Genital herpes   ? ? ?Past Surgical History:  ?Procedure Laterality Date  ? breast lift  2008  ? [place]  ? COLONOSCOPY WITH PROPOFOL N/A 04/06/2017  ? Procedure: COLONOSCOPY WITH PROPOFOL;  Surgeon: Jonathon Bellows, MD;  Location: [facility] ENDOSCOPY;  Service: Gastroenterology;  Laterality: N/A;  ? Cyst under tongue    ? As a child  ? ? ?Family History  ?Problem Relation Age of Onset  ? Cancer Father   ?     CLL  ? Breast cancer Maternal Grandmother 33  ? Hypertension Neg Hx   ? Heart  disease Neg Hx   ? Diabetes Neg Hx   ? ? ?Social History  ? ?Socioeconomic History  ? Marital status: Married  ?  Spouse name: Not on file  ? Number of children: 0  ? Years of education: Not on file  ? Highest education level: Not on file  ?Occupational History  ? Occupation: HR manager  ?  Comment: [employer]  ?Tobacco Use  ? Smoking status: Never  ? Smokeless tobacco: Never  ?Vaping Use  ? Vaping Use: Never used  ?Substance and Sexual Activity  ? Alcohol use: Yes  ?  Alcohol/week: 3.0 standard drinks  ?  Types: 3 Glasses of wine per week  ?  Comment: weekly  ? Drug use: No  ? Sexual activity: Yes  ?  Birth control/protection: None, Surgical  ?  Comment: vasectomy   ?Other Topics Concern  ? Not on file  ?Social History Narrative  ? Married, 2 step-children  ? ?Social Determinants of Health  ? ?Financial Resource Strain: Not on file  ?Food Insecurity: Not on file  ?Transportation Needs: Not on file  ?Physical Activity: Not on file  ?Stress: Not on file  ?Social Connections: Not on file  ?Intimate Partner Violence: Not on file  ? ? ?Current Meds  ?Medication Sig  ? aspirin EC 81 MG tablet Take 1 tablet (81 mg total)  by mouth daily. Swallow whole.  ? ibuprofen (ADVIL,MOTRIN) 200 MG tablet Take 200 mg by mouth every 6 (six) hours as needed.  ? RESTASIS 0.05 % ophthalmic emulsion INSTILL 1 DROP INTO BOTH EYES TWICE A DAY AS DIRECTED  ? [DISCONTINUED] valACYclovir (VALTREX) 500 MG tablet TAKE 1 TABLET BY MOUTH 2 TIMES DAILY FOR 3 DAYS AS NEEDED FOR SYMPTOMS  ? ? ? ? ?ROS: ? ?Review of Systems  ?Constitutional:  Negative for fatigue, fever and unexpected weight change.  ?Respiratory:  Negative for cough, shortness of breath and wheezing.   ?Cardiovascular:  Negative for chest pain, palpitations and leg swelling.  ?Gastrointestinal:  Negative for blood in stool, constipation, diarrhea, nausea and vomiting.  ?Endocrine: Negative for cold intolerance, heat intolerance and polyuria.  ?Genitourinary:  Negative for  dyspareunia, dysuria, flank pain, frequency, genital sores, hematuria, menstrual problem, pelvic pain, urgency, vaginal bleeding, vaginal discharge and vaginal pain.  ?Musculoskeletal:  Negative for back pain, joint swelling and myalgias.  ?Skin:  Negative for rash.  ?Neurological:  Negative for dizziness, syncope, light-headedness, numbness and headaches.  ?Hematological:  Negative for adenopathy.  ?Psychiatric/Behavioral:  Negative for agitation, confusion, sleep disturbance and suicidal ideas. The patient is not nervous/anxious.   ? ? ?Objective: ?BP 90/60   Ht 5\' 4"  (1.626 m)   Wt 142 lb (64.4 kg)   LMP 10/19/2020 (Approximate)   BMI 24.37 kg/m?  ? ? ?Physical Exam ?Constitutional:   ?   Appearance: She is well-developed.  ?Genitourinary:  ?   Vulva and rectum normal.  ?   Right Labia: No rash, tenderness or lesions. ?   Left Labia: No tenderness, lesions or rash. ?   No vaginal discharge, erythema or tenderness.  ? ?   Right Adnexa: not tender and no mass present. ?   Left Adnexa: not tender and no mass present. ?   No cervical friability or polyp.  ?   Uterus is not enlarged or tender.  ?Rectum:  ?   Guaiac result negative.  ?   No rectal mass or anal fissure.  ?Breasts: ?   Right: No mass, nipple discharge, skin change or tenderness.  ?   Left: No mass, nipple discharge, skin change or tenderness.  ?Neck:  ?   Thyroid: No thyromegaly.  ?Cardiovascular:  ?   Rate and Rhythm: Normal rate and regular rhythm.  ?   Heart sounds: Normal heart sounds. No murmur heard. ?Pulmonary:  ?   Effort: Pulmonary effort is normal.  ?   Breath sounds: Normal breath sounds.  ?Abdominal:  ?   Palpations: Abdomen is soft.  ?   Tenderness: There is no abdominal tenderness. There is no guarding or rebound.  ?Musculoskeletal:     ?   General: Normal range of motion.  ?   Cervical back: Normal range of motion.  ?Lymphadenopathy:  ?   Cervical: No cervical adenopathy.  ?Neurological:  ?   General: No focal deficit present.  ?    Mental Status: She is alert and oriented to person, place, and time.  ?   Cranial Nerves: No cranial nerve deficit.  ?Skin: ?   General: Skin is warm and dry.  ?Psychiatric:     ?   Mood and Affect: Mood normal.     ?   Behavior: Behavior normal.     ?   Thought Content: Thought content normal.     ?   Judgment: Judgment normal.  ?Vitals reviewed.  ? ? ?Assessment/Plan: ?Encounter for  annual routine gynecological examination ? ?Encounter for screening mammogram for malignant neoplasm of breast; pt current on mammo ? ?Herpes simplex vulvovaginitis - Plan: valACYclovir (VALTREX) 500 MG tablet; Rx RF. Occas sx. ? ?Abnormal uterine bleeding (AUB)--no sx since 10/22. F/u prn.  ? ?Perimenopause ? ?Blood tests for routine general physical examination - Plan: Comprehensive metabolic panel, Lipid panel ? ?Screening cholesterol level - Plan: Lipid panel ? ? ?       ?Meds ordered this encounter  ?Medications  ? valACYclovir (VALTREX) 500 MG tablet  ?  Sig: TAKE 1 TABLET BY MOUTH 2 TIMES DAILY FOR 3 DAYS AS NEEDED FOR SYMPTOMS  ?  Dispense:  30 tablet  ?  Refill:  0  ?  Order Specific Question:   Supervising Provider  ?  Answer:   CONSTANT, PEGGY [[id number]]  ? ? ?GYN counsel menopause, adequate intake of calcium and vitamin D, diet and exercise ? ?  F/U ? Return in about 1 year (around 05/20/2022). ? ?Hazell Siwik B. Shadara Lopez, PA-C ?05/19/2021 ?3:00 PM ?

## 2021-05-19 ENCOUNTER — Ambulatory Visit (INDEPENDENT_AMBULATORY_CARE_PROVIDER_SITE_OTHER): Payer: BC Managed Care – PPO | Admitting: Obstetrics and Gynecology

## 2021-05-19 ENCOUNTER — Encounter: Payer: Self-pay | Admitting: Obstetrics and Gynecology

## 2021-05-19 VITALS — BP 90/60 | Ht 64.0 in | Wt 142.0 lb

## 2021-05-19 DIAGNOSIS — Z0000 Encounter for general adult medical examination without abnormal findings: Secondary | ICD-10-CM

## 2021-05-19 DIAGNOSIS — N939 Abnormal uterine and vaginal bleeding, unspecified: Secondary | ICD-10-CM

## 2021-05-19 DIAGNOSIS — Z1231 Encounter for screening mammogram for malignant neoplasm of breast: Secondary | ICD-10-CM | POA: Diagnosis not present

## 2021-05-19 DIAGNOSIS — A6004 Herpesviral vulvovaginitis: Secondary | ICD-10-CM

## 2021-05-19 DIAGNOSIS — Z01419 Encounter for gynecological examination (general) (routine) without abnormal findings: Secondary | ICD-10-CM | POA: Diagnosis not present

## 2021-05-19 DIAGNOSIS — N951 Menopausal and female climacteric states: Secondary | ICD-10-CM

## 2021-05-19 DIAGNOSIS — Z13220 Encounter for screening for lipoid disorders: Secondary | ICD-10-CM

## 2021-05-19 MED ORDER — VALACYCLOVIR HCL 500 MG PO TABS: ORAL_TABLET | ORAL | 0 refills | Status: DC

## 2021-05-19 NOTE — Patient Instructions (Signed)
I value your feedback and you entrusting us with your care. If you get a [facility] patient survey, I would appreciate you taking the time to let us know about your experience today. Thank you! ? ? ?

## 2021-05-20 ENCOUNTER — Ambulatory Visit: Payer: BC Managed Care – PPO | Admitting: Obstetrics and Gynecology

## 2021-05-31 ENCOUNTER — Other Ambulatory Visit: Payer: Self-pay | Admitting: Obstetrics and Gynecology

## 2021-05-31 DIAGNOSIS — A6004 Herpesviral vulvovaginitis: Secondary | ICD-10-CM

## 2021-07-22 ENCOUNTER — Other Ambulatory Visit
Admission: RE | Admit: 2021-07-22 | Discharge: 2021-07-22 | Disposition: A | Discharge status: Home / Self Care | Payer: BC Managed Care – PPO | Source: Ambulatory Visit | Attending: Student | Admitting: Student

## 2021-07-22 DIAGNOSIS — R0781 Pleurodynia: Secondary | ICD-10-CM | POA: Insufficient documentation

## 2021-07-22 DIAGNOSIS — R9431 Abnormal electrocardiogram [ECG] [EKG]: Secondary | ICD-10-CM | POA: Diagnosis not present

## 2021-07-22 DIAGNOSIS — R059 Cough, unspecified: Secondary | ICD-10-CM | POA: Diagnosis not present

## 2021-07-22 DIAGNOSIS — R051 Acute cough: Secondary | ICD-10-CM | POA: Insufficient documentation

## 2021-07-22 DIAGNOSIS — J189 Pneumonia, unspecified organism: Secondary | ICD-10-CM | POA: Diagnosis not present

## 2021-07-22 DIAGNOSIS — R918 Other nonspecific abnormal finding of lung field: Secondary | ICD-10-CM | POA: Diagnosis not present

## 2021-07-22 DIAGNOSIS — J029 Acute pharyngitis, unspecified: Secondary | ICD-10-CM | POA: Diagnosis not present

## 2021-07-22 DIAGNOSIS — R079 Chest pain, unspecified: Secondary | ICD-10-CM | POA: Diagnosis not present

## 2021-07-22 DIAGNOSIS — J40 Bronchitis, not specified as acute or chronic: Secondary | ICD-10-CM | POA: Diagnosis not present

## 2021-07-22 DIAGNOSIS — R7989 Other specified abnormal findings of blood chemistry: Secondary | ICD-10-CM | POA: Diagnosis not present

## 2021-07-22 DIAGNOSIS — R509 Fever, unspecified: Secondary | ICD-10-CM | POA: Diagnosis not present

## 2021-07-22 LAB — TROPONIN I (HIGH SENSITIVITY): Troponin I (High Sensitivity): 7 ng/L (ref <18)

## 2021-07-22 LAB — D-DIMER, QUANTITATIVE: D-Dimer, Quant: 0.61 ug/mL-FEU — ABNORMAL HIGH (ref 0.00–0.50)

## 2021-07-28 ENCOUNTER — Encounter: Payer: Self-pay | Admitting: Internal Medicine

## 2021-07-28 ENCOUNTER — Ambulatory Visit: Payer: BC Managed Care – PPO | Admitting: Internal Medicine

## 2021-07-28 VITALS — BP 92/70 | HR 68 | Temp 97.6°F | Ht 64.0 in | Wt 133.0 lb

## 2021-07-28 DIAGNOSIS — J181 Lobar pneumonia, unspecified organism: Secondary | ICD-10-CM

## 2021-07-28 NOTE — Progress Notes (Signed)
Subjective:    Patient ID: Mercedes Daniels, female    DOB: [birth year], 56 y.o.   MRN: [id number]  HPI Here for ER follow up with pneumonia diagnosed  Went to [facility] urgent care  Had some cough and sore throat---thought it would get better But instead it got worse---with severe pain in right upper chest Seen 7/6--??bronchitis. Elevated d-dimer so sent to ER Had CT angio at [facility] in [place]---just showed RUL pneumonia but no clot  Got 5 days of augmentin from ER Feels "a lot better" Just a slight pain in right upper chest  Did have fever initially--gone for a while Cough is just slight now  Current Outpatient Medications on File Prior to Visit  Medication Sig Dispense Refill   benzonatate (TESSALON) 200 MG capsule Take 200 mg by mouth 3 (three) times daily as needed.     ibuprofen (ADVIL,MOTRIN) 200 MG tablet Take 200 mg by mouth every 6 (six) hours as needed.     RESTASIS 0.05 % ophthalmic emulsion INSTILL 1 DROP INTO BOTH EYES TWICE A DAY AS DIRECTED     valACYclovir (VALTREX) 500 MG tablet TAKE 1 TABLET BY MOUTH 2 TIMES DAILY FOR 3 DAYS AS NEEDED FOR SYMPTOMS 30 tablet 0   VENTOLIN HFA 108 (90 Base) MCG/ACT inhaler SMARTSIG:2 inhalation Via Inhaler Every 6 Hours PRN     No current facility-administered medications on file prior to visit.    No Known Allergies  Past Medical History:  Diagnosis Date   Breast cyst, left    Genital herpes     Past Surgical History:  Procedure Laterality Date   breast lift  2008   [place]   COLONOSCOPY WITH PROPOFOL N/A 04/06/2017   Procedure: COLONOSCOPY WITH PROPOFOL;  Surgeon: Wyline Mood, MD;  Location: [facility] ENDOSCOPY;  Service: Gastroenterology;  Laterality: N/A;   Cyst under tongue     As a child    Family History  Problem Relation Age of Onset   Cancer Father        CLL   Breast cancer Maternal Grandmother 32   Hypertension Neg Hx    Heart disease Neg Hx    Diabetes Neg Hx     Social History   Socioeconomic  History   Marital status: Married    Spouse name: Not on file   Number of children: 0   Years of education: Not on file   Highest education level: Not on file  Occupational History   Occupation: HR Production designer, theatre/television/film    Comment: Nurse, mental health  Tobacco Use   Smoking status: Never    Passive exposure: Past (as a child)   Smokeless tobacco: Never  Vaping Use   Vaping Use: Never used  Substance and Sexual Activity   Alcohol use: Yes    Alcohol/week: 3.0 standard drinks of alcohol    Types: 3 Glasses of wine per week    Comment: weekly   Drug use: No   Sexual activity: Yes    Birth control/protection: None, Surgical    Comment: vasectomy   Other Topics Concern   Not on file  Social History Narrative   Married, 2 step-children   Social Determinants of Health   Financial Resource Strain: Not on file  Food Insecurity: Not on file  Transportation Needs: Not on file  Physical Activity: Not on file  Stress: Not on file  Social Connections: Not on file  Intimate Partner Violence: Not on file   Review of Systems No SOB now---maybe some  just associated with the pleuritic pain at first No N/V Appetite still off--but eating Some diarrhea before the antibiotic---resolved Did have air travel in [place] about a week before this     Objective:   Physical Exam Constitutional:      Appearance: Normal appearance.  Pulmonary:     Effort: Pulmonary effort is normal.     Breath sounds: Normal breath sounds. No wheezing or rales.  Musculoskeletal:     Cervical back: Neck supple.  Lymphadenopathy:     Cervical: No cervical adenopathy.  Neurological:     Mental Status: She is alert.            Assessment & Plan:

## 2021-07-28 NOTE — Assessment & Plan Note (Signed)
RUL findings on CT Not sure of CXR findings Clinically better now after augmentin x 5 days Will recheck CXR in about 1 month

## 2021-11-01 DIAGNOSIS — D485 Neoplasm of uncertain behavior of skin: Secondary | ICD-10-CM | POA: Diagnosis not present

## 2021-11-01 DIAGNOSIS — L820 Inflamed seborrheic keratosis: Secondary | ICD-10-CM | POA: Diagnosis not present

## 2022-04-07 DIAGNOSIS — Z1231 Encounter for screening mammogram for malignant neoplasm of breast: Secondary | ICD-10-CM | POA: Diagnosis not present

## 2022-04-07 LAB — HM MAMMOGRAPHY

## 2022-04-12 ENCOUNTER — Encounter: Payer: Self-pay | Admitting: Obstetrics and Gynecology

## 2022-05-12 DIAGNOSIS — D2272 Melanocytic nevi of left lower limb, including hip: Secondary | ICD-10-CM | POA: Diagnosis not present

## 2022-05-12 DIAGNOSIS — D2262 Melanocytic nevi of left upper limb, including shoulder: Secondary | ICD-10-CM | POA: Diagnosis not present

## 2022-05-12 DIAGNOSIS — D225 Melanocytic nevi of trunk: Secondary | ICD-10-CM | POA: Diagnosis not present

## 2022-05-12 DIAGNOSIS — D2261 Melanocytic nevi of right upper limb, including shoulder: Secondary | ICD-10-CM | POA: Diagnosis not present

## 2022-05-25 NOTE — Progress Notes (Unsigned)
PCP: Karie Schwalbe, MD   No chief complaint on file.   HPI:      [name] is a 57 y.o. G0P0000 who LMP was No LMP recorded. (Menstrual status: Irregular Periods)., presents today for her annual examination.  Her menses are irregular due to perimenopause. Had AUB 10/22, given aygestin for 1 cycle; no bleeding/spotting since. No vasomotor sx.   Sex activity: single partner, contraception - vasectomy. No vag dryness/bleeding. Occas pain but pt not sure if vaginal or pelvic. Will monitor and f/u prn.   Last Pap: 03/14/18 Results were: no abnormalities /neg HPV DNA.  Hx of STDs: HSV, takes valtrex prn sx. Used to get outbreaks before/after menses, but rare now.  Needs RF  Last mammogram: 04/07/22 at [facility], Results were: normal--routine follow-up in 12 months, followed by Dr. Lemar Livings in past for stable LT breast cysts and breast pain.  There is a FH of breast cancer in her MGM, genetic testing not indicated. There is no FH of ovarian cancer. The patient does occas self-breast exams.  Colonoscopy: 2019 without abnormalities; repeat after 10 yrs. [facility].  Tobacco use: The patient denies current or previous tobacco use. Alcohol use: social drinker  No drug use Exercise: very active  She does get adequate calcium and Vitamin D in her diet. Labs with PCP 7/23  Past Medical History:  Diagnosis Date   Breast cyst, left    Genital herpes     Past Surgical History:  Procedure Laterality Date   breast lift  2008   [place]   COLONOSCOPY WITH PROPOFOL N/A 04/06/2017   Procedure: COLONOSCOPY WITH PROPOFOL;  Surgeon: Wyline Mood, MD;  Location: [facility] ENDOSCOPY;  Service: Gastroenterology;  Laterality: N/A;   Cyst under tongue     As a child    Family History  Problem Relation Age of Onset   Cancer Father        CLL   Breast cancer Maternal Grandmother 61   Hypertension Neg Hx    Heart disease Neg Hx    Diabetes Neg Hx     Social History   Socioeconomic History    Marital status: Married    Spouse name: Not on file   Number of children: 0   Years of education: Not on file   Highest education level: Not on file  Occupational History   Occupation: HR Production designer, theatre/television/film    Comment: Nurse, mental health  Tobacco Use   Smoking status: Never    Passive exposure: Past (as a child)   Smokeless tobacco: Never  Vaping Use   Vaping Use: Never used  Substance and Sexual Activity   Alcohol use: Yes    Alcohol/week: 3.0 standard drinks of alcohol    Types: 3 Glasses of wine per week    Comment: weekly   Drug use: No   Sexual activity: Yes    Birth control/protection: None, Surgical    Comment: vasectomy   Other Topics Concern   Not on file  Social History Narrative   Married, 2 step-children   Social Determinants of Health   Financial Resource Strain: Not on file  Food Insecurity: Not on file  Transportation Needs: Not on file  Physical Activity: Not on file  Stress: Not on file  Social Connections: Not on file  Intimate Partner Violence: Not on file    No outpatient medications have been marked as taking for the 05/26/22 encounter (Appointment) with Layla Gramm, Ilona Sorrel, PA-C.  ROS:  Review of Systems  Constitutional:  Negative for fatigue, fever and unexpected weight change.  Respiratory:  Negative for cough, shortness of breath and wheezing.   Cardiovascular:  Negative for chest pain, palpitations and leg swelling.  Gastrointestinal:  Negative for blood in stool, constipation, diarrhea, nausea and vomiting.  Endocrine: Negative for cold intolerance, heat intolerance and polyuria.  Genitourinary:  Negative for dyspareunia, dysuria, flank pain, frequency, genital sores, hematuria, menstrual problem, pelvic pain, urgency, vaginal bleeding, vaginal discharge and vaginal pain.  Musculoskeletal:  Negative for back pain, joint swelling and myalgias.  Skin:  Negative for rash.  Neurological:  Negative for dizziness, syncope, light-headedness, numbness  and headaches.  Hematological:  Negative for adenopathy.  Psychiatric/Behavioral:  Negative for agitation, confusion, sleep disturbance and suicidal ideas. The patient is not nervous/anxious.      Objective: There were no vitals taken for this visit.   Physical Exam Constitutional:      Appearance: She is well-developed.  Genitourinary:     Vulva and rectum normal.     Right Labia: No rash, tenderness or lesions.    Left Labia: No tenderness, lesions or rash.    No vaginal discharge, erythema or tenderness.      Right Adnexa: not tender and no mass present.    Left Adnexa: not tender and no mass present.    No cervical friability or polyp.     Uterus is not enlarged or tender.  Rectum:     Guaiac result negative.     No rectal mass or anal fissure.  Breasts:    Right: No mass, nipple discharge, skin change or tenderness.     Left: No mass, nipple discharge, skin change or tenderness.  Neck:     Thyroid: No thyromegaly.  Cardiovascular:     Rate and Rhythm: Normal rate and regular rhythm.     Heart sounds: Normal heart sounds. No murmur heard. Pulmonary:     Effort: Pulmonary effort is normal.     Breath sounds: Normal breath sounds.  Abdominal:     Palpations: Abdomen is soft.     Tenderness: There is no abdominal tenderness. There is no guarding or rebound.  Musculoskeletal:        General: Normal range of motion.     Cervical back: Normal range of motion.  Lymphadenopathy:     Cervical: No cervical adenopathy.  Neurological:     General: No focal deficit present.     Mental Status: She is alert and oriented to person, place, and time.     Cranial Nerves: No cranial nerve deficit.  Skin:    General: Skin is warm and dry.  Psychiatric:        Mood and Affect: Mood normal.        Behavior: Behavior normal.        Thought Content: Thought content normal.        Judgment: Judgment normal.  Vitals reviewed.     Assessment/Plan: Encounter for annual routine  gynecological examination  Encounter for screening mammogram for malignant neoplasm of breast; pt current on mammo  Herpes simplex vulvovaginitis - Plan: valACYclovir (VALTREX) 500 MG tablet; Rx RF. Occas sx.  Abnormal uterine bleeding (AUB)--no sx since 10/22. F/u prn.   Perimenopause  Blood tests for routine general physical examination - Plan: Comprehensive metabolic panel, Lipid panel  Screening cholesterol level - Plan: Lipid panel          No orders of the defined types were placed  in this encounter.   GYN counsel menopause, adequate intake of calcium and vitamin D, diet and exercise    F/U  No follow-ups on file.  Kabria Hetzer B. Kleo Dungee, PA-C 05/25/2022 3:16 PM

## 2022-05-26 ENCOUNTER — Ambulatory Visit (INDEPENDENT_AMBULATORY_CARE_PROVIDER_SITE_OTHER): Payer: BC Managed Care – PPO | Admitting: Obstetrics and Gynecology

## 2022-05-26 ENCOUNTER — Encounter: Payer: Self-pay | Admitting: Obstetrics and Gynecology

## 2022-05-26 VITALS — BP 104/70 | Ht 64.0 in | Wt 135.0 lb

## 2022-05-26 DIAGNOSIS — A6004 Herpesviral vulvovaginitis: Secondary | ICD-10-CM | POA: Diagnosis not present

## 2022-05-26 DIAGNOSIS — Z01419 Encounter for gynecological examination (general) (routine) without abnormal findings: Secondary | ICD-10-CM

## 2022-05-26 DIAGNOSIS — Z1151 Encounter for screening for human papillomavirus (HPV): Secondary | ICD-10-CM

## 2022-05-26 DIAGNOSIS — Z01411 Encounter for gynecological examination (general) (routine) with abnormal findings: Secondary | ICD-10-CM

## 2022-05-26 DIAGNOSIS — R7989 Other specified abnormal findings of blood chemistry: Secondary | ICD-10-CM

## 2022-05-26 DIAGNOSIS — Z13220 Encounter for screening for lipoid disorders: Secondary | ICD-10-CM

## 2022-05-26 DIAGNOSIS — Z124 Encounter for screening for malignant neoplasm of cervix: Secondary | ICD-10-CM | POA: Diagnosis not present

## 2022-05-26 DIAGNOSIS — Z1231 Encounter for screening mammogram for malignant neoplasm of breast: Secondary | ICD-10-CM

## 2022-05-26 DIAGNOSIS — Z0000 Encounter for general adult medical examination without abnormal findings: Secondary | ICD-10-CM | POA: Diagnosis not present

## 2022-05-26 MED ORDER — VALACYCLOVIR HCL 500 MG PO TABS
ORAL_TABLET | ORAL | 0 refills | Status: DC
Start: 2022-05-26 — End: 2023-09-14

## 2022-05-26 NOTE — Patient Instructions (Signed)
I value your feedback and you entrusting us with your care. If you get a [facility] patient survey, I would appreciate you taking the time to let us know about your experience today. Thank you! ? ? ?

## 2022-05-27 LAB — COMPREHENSIVE METABOLIC PANEL
ALT: 45 IU/L — ABNORMAL HIGH (ref 0–32)
AST: 45 IU/L — ABNORMAL HIGH (ref 0–40)
Albumin/Globulin Ratio: 1.6 (ref 1.2–2.2)
Albumin: 4.4 g/dL (ref 3.8–4.9)
Alkaline Phosphatase: 93 IU/L (ref 44–121)
BUN/Creatinine Ratio: 24 — ABNORMAL HIGH (ref 9–23)
BUN: 20 mg/dL (ref 6–24)
Bilirubin Total: 0.7 mg/dL (ref 0.0–1.2)
CO2: 24 mmol/L (ref 20–29)
Calcium: 9.7 mg/dL (ref 8.7–10.2)
Chloride: 100 mmol/L (ref 96–106)
Creatinine, Ser: 0.83 mg/dL (ref 0.57–1.00)
Globulin, Total: 2.7 g/dL (ref 1.5–4.5)
Glucose: 70 mg/dL (ref 70–99)
Potassium: 4.1 mmol/L (ref 3.5–5.2)
Sodium: 139 mmol/L (ref 134–144)
Total Protein: 7.1 g/dL (ref 6.0–8.5)
eGFR: 83 mL/min/{1.73_m2} (ref >59)

## 2022-05-27 LAB — LIPID PANEL
Chol/HDL Ratio: 2.1 ratio (ref 0.0–4.4)
Cholesterol, Total: 264 mg/dL — ABNORMAL HIGH (ref 100–199)
HDL: 123 mg/dL (ref >39)
LDL Chol Calc (NIH): 135 mg/dL — ABNORMAL HIGH (ref 0–99)
Triglycerides: 43 mg/dL (ref 0–149)
VLDL Cholesterol Cal: 6 mg/dL (ref 5–40)

## 2022-05-27 NOTE — Addendum Note (Signed)
Addended by: Althea Grimmer B on: 05/27/2022 01:25 PM   Modules accepted: Orders

## 2022-05-30 ENCOUNTER — Encounter: Payer: Self-pay | Admitting: Obstetrics and Gynecology

## 2022-05-30 DIAGNOSIS — R7989 Other specified abnormal findings of blood chemistry: Secondary | ICD-10-CM

## 2022-05-30 DIAGNOSIS — Z113 Encounter for screening for infections with a predominantly sexual mode of transmission: Secondary | ICD-10-CM

## 2022-05-31 NOTE — Telephone Encounter (Signed)
Spoke with pt. Questions answered. Not taking tylenol/ibup regularly. Drinks some alcohol. Husband treated for Hep C about 10 yrs ago, pt never tested. Will check CMP and Hep C in a couple wks.

## 2022-06-01 ENCOUNTER — Other Ambulatory Visit
Admission: RE | Admit: 2022-06-01 | Discharge: 2022-06-01 | Disposition: A | Discharge status: Home / Self Care | Payer: BC Managed Care – PPO | Source: Ambulatory Visit | Attending: Internal Medicine | Admitting: Internal Medicine

## 2022-06-01 DIAGNOSIS — R55 Syncope and collapse: Secondary | ICD-10-CM | POA: Insufficient documentation

## 2022-06-01 DIAGNOSIS — R002 Palpitations: Secondary | ICD-10-CM | POA: Diagnosis not present

## 2022-06-01 LAB — TROPONIN I (HIGH SENSITIVITY): Troponin I (High Sensitivity): 4 ng/L (ref <18)

## 2022-06-02 LAB — IGP, APTIMA HPV: HPV Aptima: NEGATIVE

## 2022-06-07 ENCOUNTER — Other Ambulatory Visit: Payer: Self-pay | Admitting: Obstetrics and Gynecology

## 2022-06-07 DIAGNOSIS — A6004 Herpesviral vulvovaginitis: Secondary | ICD-10-CM

## 2022-07-05 DIAGNOSIS — R002 Palpitations: Secondary | ICD-10-CM | POA: Diagnosis not present

## 2022-07-05 DIAGNOSIS — R55 Syncope and collapse: Secondary | ICD-10-CM | POA: Diagnosis not present

## 2022-07-06 ENCOUNTER — Other Ambulatory Visit: Payer: BC Managed Care – PPO

## 2022-07-06 DIAGNOSIS — Z113 Encounter for screening for infections with a predominantly sexual mode of transmission: Secondary | ICD-10-CM

## 2022-07-06 DIAGNOSIS — R7989 Other specified abnormal findings of blood chemistry: Secondary | ICD-10-CM | POA: Diagnosis not present

## 2022-07-07 LAB — COMPREHENSIVE METABOLIC PANEL
ALT: 17 IU/L (ref 0–32)
AST: 20 IU/L (ref 0–40)
Albumin: 4.5 g/dL (ref 3.8–4.9)
Alkaline Phosphatase: 82 IU/L (ref 44–121)
BUN/Creatinine Ratio: 21 (ref 9–23)
BUN: 18 mg/dL (ref 6–24)
Bilirubin Total: 0.4 mg/dL (ref 0.0–1.2)
CO2: 25 mmol/L (ref 20–29)
Calcium: 10 mg/dL (ref 8.7–10.2)
Chloride: 99 mmol/L (ref 96–106)
Creatinine, Ser: 0.85 mg/dL (ref 0.57–1.00)
Globulin, Total: 2.6 g/dL (ref 1.5–4.5)
Glucose: 89 mg/dL (ref 70–99)
Potassium: 4.3 mmol/L (ref 3.5–5.2)
Sodium: 138 mmol/L (ref 134–144)
Total Protein: 7.1 g/dL (ref 6.0–8.5)
eGFR: 81 mL/min/{1.73_m2} (ref >59)

## 2022-07-07 LAB — HEPATITIS C ANTIBODY: Hep C Virus Ab: NONREACTIVE

## 2022-07-13 DIAGNOSIS — R55 Syncope and collapse: Secondary | ICD-10-CM | POA: Diagnosis not present

## 2022-09-22 DIAGNOSIS — Z03818 Encounter for observation for suspected exposure to other biological agents ruled out: Secondary | ICD-10-CM | POA: Diagnosis not present

## 2022-09-28 DIAGNOSIS — H04123 Dry eye syndrome of bilateral lacrimal glands: Secondary | ICD-10-CM | POA: Diagnosis not present

## 2022-11-11 IMAGING — CT CT HEART MORP W/ CTA COR W/ SCORE W/ CA W/CM &/OR W/O CM
2 of 11 series · 7 of 20 positions shown, 8 images · non-contrast
Comparison: No priors.

Addendum:
CLINICAL DATA: Dyspnea, palpitations

EXAM:
Cardiac/Coronary  CTA
TECHNIQUE: The patient was scanned on a Siemens Somatom go.Top scanner.

[Series 26: multiphase % cta coronary 0.60 · axial · 0.31mm/px · z∈[-1103,-1026]mm · 4 of 3531 slices shown, 5 images]
[im 707/3531  vessel]
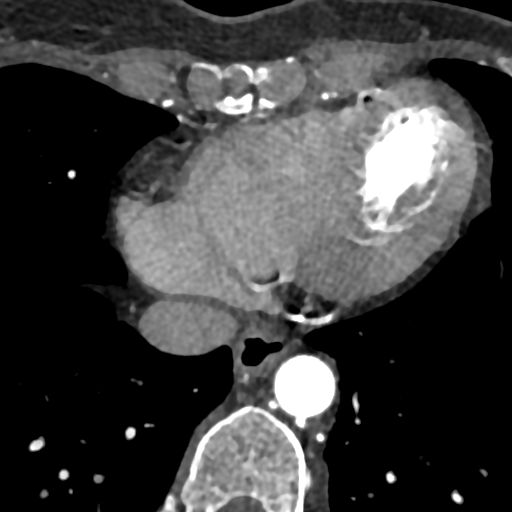
[im 707/3531  lung]
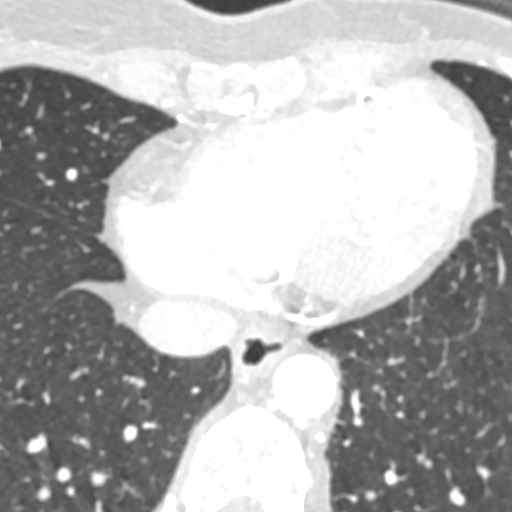
[im 1413/3531  vessel]
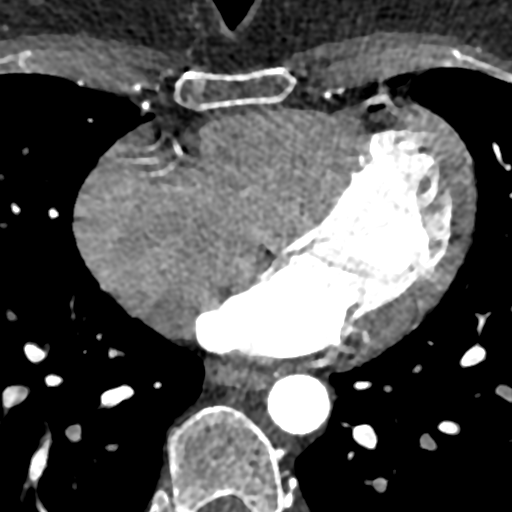
[im 2119/3531  vessel]
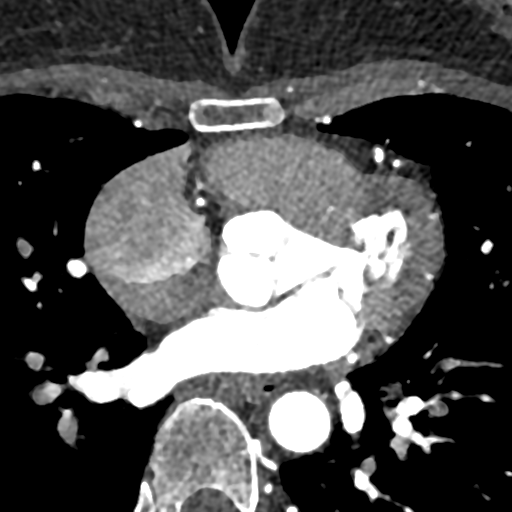
[im 2825/3531  vessel]
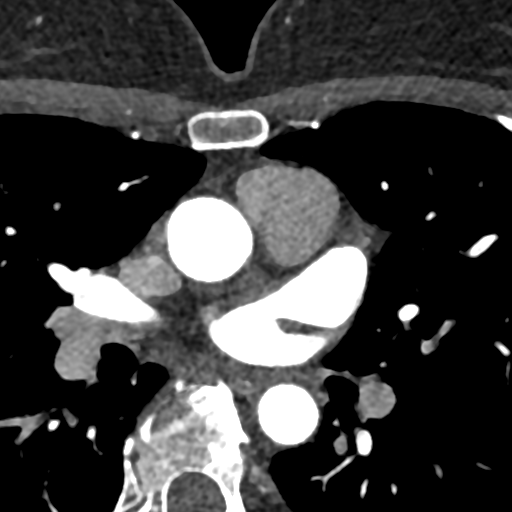

[Series 39: ms multiphase cta coronary 0.60 · axial · 0.31mm/px · z∈[-1096,-1032]mm · 3 of 2888 slices shown]
[im 722/2888  vessel]
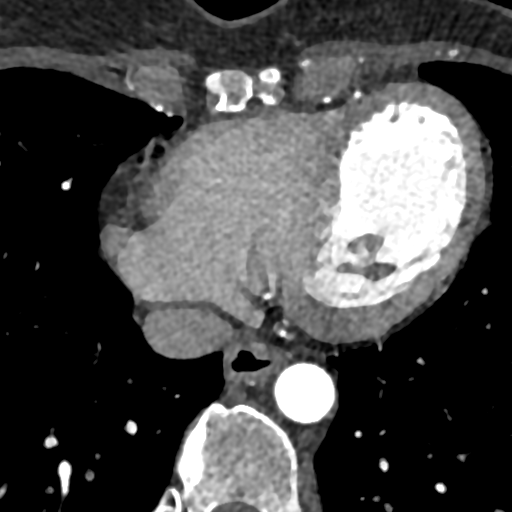
[im 1444/2888  vessel]
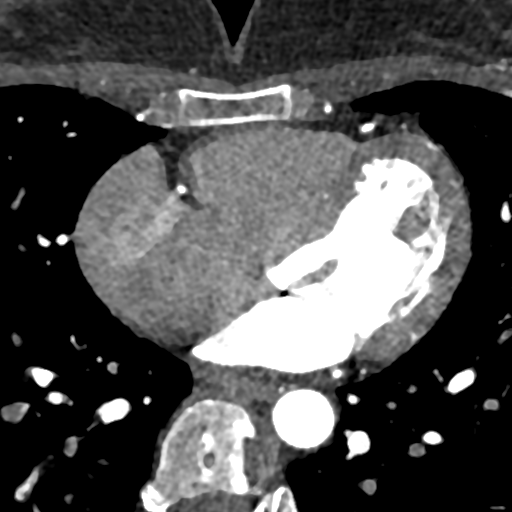
[im 2166/2888  vessel]
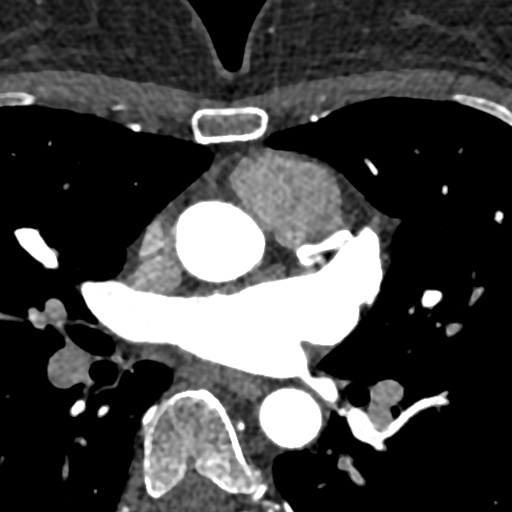

[7 of 20 positions shown; findings below may reference images not displayed]



Aortic Valve:  Trileaflet.  No calcifications.

Coronary Arteries:  Normal coronary origin.  Right dominance.

RCA is a small non dominant artery.  There is no plaque.

Left main gives rise to LAD and LCX arteries.  LM has no disease

LAD  has no plaque.

LCX is a non-dominant artery that gives rise to three obtuse
marginal branches. There is no plaque.

Other findings:

Normal pulmonary vein drainage into the left atrium.

Normal left atrial appendage without a thrombus.

Normal size of the pulmonary artery.
IMPRESSION: 1. Normal coronary calcium score of 0. Patient is low risk for
coronary events.

2. Normal coronary origin with left  dominance.

3. No evidence of CAD.

4. CAD-RADS 0. Consider non-atherosclerotic causes of chest pain.

EXAM:
OVER-READ INTERPRETATION  CT CHEST

The following report is an over-read performed by radiologist Dr.
over-read does not include interpretation of cardiac or coronary
anatomy or pathology. The coronary calcium score and cardiac CTA
interpretation by the cardiologist is attached.
FINDINGS: I study is limited by considerable patient respiratory motion. With
these limitations in mind, there are few clustered
peribronchovascular micro nodules measuring 4 mm or less in size
which are somewhat indistinct, most evident in the right middle lobe
and lateral aspect of the right upper lobe, nonspecific, but favored
to be of infectious or inflammatory etiology. A few scattered areas
of cylindrical bronchiectasis are also noted, most evident in the
inferior segment of the lingula. Within the visualized portions of
the thorax there are no suspicious appearing pulmonary nodules or
masses, there is no acute consolidative airspace disease, no pleural
effusions, no pneumothorax and no lymphadenopathy. Visualized
portions of the upper abdomen are unremarkable. There are no
aggressive appearing lytic or blastic lesions noted in the
visualized portions of the skeleton.
IMPRESSION: 1. Scattered pulmonary nodules measuring 4 mm or less in size in the
right lung, nonspecific, but statistically likely benign. No
follow-up needed if patient is low-risk (and has no known or
suspected primary neoplasm). Non-contrast chest CT can be considered
in 12 months if patient is high-risk. This recommendation follows
the consensus statement: Guidelines for Management of Incidental
Pulmonary Nodules Detected on CT Images: From the [HOSPITAL]
2. Bronchiectasis.



Aortic Valve:  Trileaflet.  No calcifications.

Coronary Arteries:  Normal coronary origin.  Right dominance.

RCA is a small non dominant artery.  There is no plaque.

Left main gives rise to LAD and LCX arteries.  LM has no disease

LAD  has no plaque.

LCX is a non-dominant artery that gives rise to three obtuse
marginal branches. There is no plaque.

Other findings:

Normal pulmonary vein drainage into the left atrium.

Normal left atrial appendage without a thrombus.

Normal size of the pulmonary artery.
IMPRESSION: 1. Normal coronary calcium score of 0. Patient is low risk for
coronary events.

2. Normal coronary origin with left  dominance.

3. No evidence of CAD.

4. CAD-RADS 0. Consider non-atherosclerotic causes of chest pain.

## 2022-11-19 DIAGNOSIS — J019 Acute sinusitis, unspecified: Secondary | ICD-10-CM | POA: Diagnosis not present

## 2022-11-19 DIAGNOSIS — J029 Acute pharyngitis, unspecified: Secondary | ICD-10-CM | POA: Diagnosis not present

## 2022-12-08 DIAGNOSIS — J029 Acute pharyngitis, unspecified: Secondary | ICD-10-CM | POA: Diagnosis not present

## 2023-04-13 ENCOUNTER — Encounter: Payer: Self-pay | Admitting: Obstetrics and Gynecology

## 2023-04-16 NOTE — Telephone Encounter (Signed)
 Pls call pt ASAP about appt Tues same day slot. Pls don't use 11;15 bc I may need to get worked in to see dentist at lunch.

## 2023-04-17 NOTE — Progress Notes (Unsigned)
 Mercedes Schwalbe, MD   No chief complaint on file.   HPI:      [name] is a 58 y.o. G0P0000 whose LMP was Patient's last menstrual period was 10/19/2020 (approximate)., presents today for breast mass    Patient Active Problem List   Diagnosis Date Noted   Lobar pneumonia (HCC) 07/28/2021   Palpitations 08/13/2020   Chest tightness 08/13/2020   Abnormal uterine bleeding (AUB) 03/18/2019   COVID-19 virus infection 12/18/2018   Genital herpes 03/13/2017   Fibrocystic disease of left breast 02/02/2017   Urinary frequency 07/13/2015   VERTIGO 08/17/2007    Past Surgical History:  Procedure Laterality Date   breast lift  2008   [place]   COLONOSCOPY WITH PROPOFOL N/A 04/06/2017   Procedure: COLONOSCOPY WITH PROPOFOL;  Surgeon: Wyline Mood, MD;  Location: [facility] ENDOSCOPY;  Service: Gastroenterology;  Laterality: N/A;   Cyst under tongue     As a child    Family History  Problem Relation Age of Onset   Melanoma Mother    Lung cancer Mother        Stage 4   Cancer Father        CLL   Breast cancer Maternal Grandmother 51   Hypertension Neg Hx    Heart disease Neg Hx    Diabetes Neg Hx     Social History   Socioeconomic History   Marital status: Married    Spouse name: Not on file   Number of children: 0   Years of education: Not on file   Highest education level: Not on file  Occupational History   Occupation: HR Production designer, theatre/television/film    Comment: Nurse, mental health  Tobacco Use   Smoking status: Never    Passive exposure: Past (as a child)   Smokeless tobacco: Never  Vaping Use   Vaping status: Never Used  Substance and Sexual Activity   Alcohol use: Yes    Alcohol/week: 3.0 standard drinks of alcohol    Types: 3 Glasses of wine per week    Comment: weekly   Drug use: No   Sexual activity: Yes    Birth control/protection: Surgical    Comment: vasectomy   Other Topics Concern   Not on file  Social History Narrative   Married, 2 step-children    Social Drivers of Corporate investment banker Strain: Not on file  Food Insecurity: Not on file  Transportation Needs: Not on file  Physical Activity: Not on file  Stress: Not on file  Social Connections: Unknown (06/01/2021)   Received from [facility], [facility]   Social Network    Social Network: Not on file  Intimate Partner Violence: Unknown (04/23/2021)   Received from [facility], [facility]   HITS    Physically Hurt: Not on file    Insult or Talk Down To: Not on file    Threaten Physical Harm: Not on file    Scream or Curse: Not on file    Outpatient Medications Prior to Visit  Medication Sig Dispense Refill   ibuprofen (ADVIL,MOTRIN) 200 MG tablet Take 200 mg by mouth every 6 (six) hours as needed.     RESTASIS 0.05 % ophthalmic emulsion INSTILL 1 DROP INTO BOTH EYES TWICE A DAY AS DIRECTED     valACYclovir (VALTREX) 500 MG tablet TAKE 1 TABLET BY MOUTH 2 TIMES DAILY FOR 3 DAYS AS NEEDED FOR SYMPTOMS 30 tablet 0   No facility-administered medications prior to  visit.      ROS:  Review of Systems BREAST: No symptoms   OBJECTIVE:   Vitals:  LMP 10/19/2020 (Approximate)   Physical Exam  Results: No results found for this or any previous visit (from the past 24 hours).   Assessment/Plan: No diagnosis found.    No orders of the defined types were placed in this encounter.     No follow-ups on file.  Kyia Rhude B. Kynedi Profitt, PA-C 04/17/2023 9:32 PM

## 2023-04-18 ENCOUNTER — Ambulatory Visit: Admitting: Obstetrics and Gynecology

## 2023-04-18 ENCOUNTER — Encounter: Payer: Self-pay | Admitting: Obstetrics and Gynecology

## 2023-04-18 VITALS — BP 103/66 | HR 74 | Ht 64.0 in | Wt 141.0 lb

## 2023-04-18 DIAGNOSIS — N6321 Unspecified lump in the left breast, upper outer quadrant: Secondary | ICD-10-CM | POA: Diagnosis not present

## 2023-04-18 DIAGNOSIS — Z1231 Encounter for screening mammogram for malignant neoplasm of breast: Secondary | ICD-10-CM

## 2023-04-18 NOTE — Patient Instructions (Signed)
 I value your feedback and you entrusting [place] with your care. If you get a [place] patient survey, I would appreciate you taking the time to let us know about your experience today. Thank you! ? ? ?

## 2023-05-12 ENCOUNTER — Encounter: Payer: Self-pay | Admitting: Obstetrics and Gynecology

## 2023-09-13 NOTE — Progress Notes (Unsigned)
 PCP: Jimmy Charlie FERNS, MD   No chief complaint on file.   HPI:      [name] is a 58 y.o. G0P0000 who LMP was Patient's last menstrual period was 10/19/2020 (approximate)., presents today for her annual examination.  Her menses are absent now due to menopause. Had AUB 10/22, given aygestin  for 1 cycle; no bleeding/spotting since. Few vasomotor sx. Pt started estradiol patch 2/25 and compounded prog 1/25. Has helped with vag dryness and sleep. No VS sx.   Sex activity: single partner, contraception - vasectomy. Has some vag dryness/pain, improved with lubricants.   Last Pap: 05/26/22 Results were: no abnormalities /neg HPV DNA.  Hx of STDs: HSV, takes valtrex  prn sx. Used to get outbreaks before/after menses, but rare now.  Needs RF  Last mammogram: 05/11/23 at [facility], Results were: normal after addl views LT breast--routine follow-up in 12 months, followed by Dr. Dessa in past for stable LT breast cysts and breast pain. Had noticed LT breast mass 4/25 with neg imaging.   There is a FH of breast cancer in her MGM, genetic testing not indicated. There is no FH of ovarian cancer. The patient does not do self-breast exams.  Colonoscopy: 2019 without abnormalities; repeat after 10 yrs. [facility].  Tobacco use: The patient denies current or previous tobacco use. Alcohol use: social drinker  No drug use Exercise: very active  She does get adequate calcium and Vitamin D in her diet. Labs with PCP, has borderline lipids  Past Medical History:  Diagnosis Date   Breast cyst, left    Genital herpes     Past Surgical History:  Procedure Laterality Date   breast lift  2008   [place]   COLONOSCOPY WITH PROPOFOL  N/A 04/06/2017   Procedure: COLONOSCOPY WITH PROPOFOL ;  Surgeon: Therisa Bi, MD;  Location: [facility] ENDOSCOPY;  Service: Gastroenterology;  Laterality: N/A;   Cyst under tongue     As a child    Family History  Problem Relation Age of Onset   Melanoma Mother    Lung  cancer Mother        Stage 4   Cancer Father        CLL   Breast cancer Maternal Grandmother 57   Hypertension Neg Hx    Heart disease Neg Hx    Diabetes Neg Hx     Social History   Socioeconomic History   Marital status: Married    Spouse name: Not on file   Number of children: 0   Years of education: Not on file   Highest education level: Not on file  Occupational History   Occupation: HR Production designer, theatre/television/film    Comment: Nurse, mental health  Tobacco Use   Smoking status: Never    Passive exposure: Past (as a child)   Smokeless tobacco: Never  Vaping Use   Vaping status: Never Used  Substance and Sexual Activity   Alcohol use: Yes    Alcohol/week: 3.0 standard drinks of alcohol    Types: 3 Glasses of wine per week    Comment: weekly   Drug use: No   Sexual activity: Yes    Birth control/protection: Surgical    Comment: vasectomy   Other Topics Concern   Not on file  Social History Narrative   Married, 2 step-children   Social Drivers of Corporate investment banker Strain: Not on file  Food Insecurity: Not on file  Transportation Needs: Not on file  Physical Activity: Not on  file  Stress: Not on file  Social Connections: Unknown (06/01/2021)   Received from [facility]   Social Network    Social Network: Not on file  Intimate Partner Violence: Unknown (04/23/2021)   Received from [facility]   HITS    Physically Hurt: Not on file    Insult or Talk Down To: Not on file    Threaten Physical Harm: Not on file    Scream or Curse: Not on file    No outpatient medications have been marked as taking for the 09/14/23 encounter (Appointment) with Amaria Mundorf B, PA-C.      ROS:  Review of Systems  Constitutional:  Negative for fatigue, fever and unexpected weight change.  Respiratory:  Negative for cough, shortness of breath and wheezing.   Cardiovascular:  Negative for chest pain, palpitations and leg swelling.  Gastrointestinal:  Negative for blood in stool,  constipation, diarrhea, nausea and vomiting.  Endocrine: Negative for cold intolerance, heat intolerance and polyuria.  Genitourinary:  Positive for dyspareunia. Negative for dysuria, flank pain, frequency, genital sores, hematuria, menstrual problem, pelvic pain, urgency, vaginal bleeding, vaginal discharge and vaginal pain.  Musculoskeletal:  Negative for back pain, joint swelling and myalgias.  Skin:  Negative for rash.  Neurological:  Negative for dizziness, syncope, light-headedness, numbness and headaches.  Hematological:  Negative for adenopathy.  Psychiatric/Behavioral:  Negative for agitation, confusion, sleep disturbance and suicidal ideas. The patient is not nervous/anxious.      Objective: LMP 10/19/2020 (Approximate)    Physical Exam Constitutional:      Appearance: She is well-developed.  Genitourinary:     Vulva and rectum normal.     Right Labia: No rash, tenderness or lesions.    Left Labia: No tenderness, lesions or rash.    No vaginal discharge, erythema or tenderness.      Right Adnexa: not tender and no mass present.    Left Adnexa: not tender and no mass present.    No cervical friability or polyp.     Uterus is not enlarged or tender.  Rectum:     Guaiac result negative.     No rectal mass or anal fissure.  Breasts:    Right: No mass, nipple discharge, skin change or tenderness.     Left: No mass, nipple discharge, skin change or tenderness.  Neck:     Thyroid : No thyromegaly.  Cardiovascular:     Rate and Rhythm: Normal rate and regular rhythm.     Heart sounds: Normal heart sounds. No murmur heard. Pulmonary:     Effort: Pulmonary effort is normal.     Breath sounds: Normal breath sounds.  Abdominal:     Palpations: Abdomen is soft.     Tenderness: There is no abdominal tenderness. There is no guarding or rebound.  Musculoskeletal:        General: Normal range of motion.     Cervical back: Normal range of motion.  Lymphadenopathy:      Cervical: No cervical adenopathy.  Neurological:     General: No focal deficit present.     Mental Status: She is alert and oriented to person, place, and time.     Cranial Nerves: No cranial nerve deficit.  Skin:    General: Skin is warm and dry.  Psychiatric:        Mood and Affect: Mood normal.        Behavior: Behavior normal.        Thought Content: Thought content normal.  Judgment: Judgment normal.  Vitals reviewed.     Assessment/Plan: Encounter for annual routine gynecological examination  Cervical cancer screening - Plan: IGP, Aptima HPV  Screening for HPV (human papillomavirus) - Plan: IGP, Aptima HPV  Encounter for screening mammogram for malignant neoplasm of breast; pt current on mammo  Herpes simplex vulvovaginitis - Plan: valACYclovir  (VALTREX ) 500 MG tablet; Rx RF eRxd.   Blood tests for routine general physical examination - Plan: Comprehensive metabolic panel, Lipid panel  Screening cholesterol level - Plan: Lipid panel         No orders of the defined types were placed in this encounter.   GYN counsel menopause, adequate intake of calcium and vitamin D, diet and exercise    F/U  No follow-ups on file.  Wess Baney B. Zameer Borman, PA-C 09/13/2023 2:28 PM

## 2023-09-14 ENCOUNTER — Encounter: Payer: Self-pay | Admitting: Obstetrics and Gynecology

## 2023-09-14 ENCOUNTER — Ambulatory Visit (INDEPENDENT_AMBULATORY_CARE_PROVIDER_SITE_OTHER): Admitting: Obstetrics and Gynecology

## 2023-09-14 VITALS — BP 99/65 | HR 72 | Ht 64.0 in | Wt 137.0 lb

## 2023-09-14 DIAGNOSIS — Z79890 Hormone replacement therapy (postmenopausal): Secondary | ICD-10-CM | POA: Diagnosis not present

## 2023-09-14 DIAGNOSIS — N951 Menopausal and female climacteric states: Secondary | ICD-10-CM

## 2023-09-14 DIAGNOSIS — Z01419 Encounter for gynecological examination (general) (routine) without abnormal findings: Secondary | ICD-10-CM | POA: Diagnosis not present

## 2023-09-14 DIAGNOSIS — A6004 Herpesviral vulvovaginitis: Secondary | ICD-10-CM | POA: Diagnosis not present

## 2023-09-14 DIAGNOSIS — Z1231 Encounter for screening mammogram for malignant neoplasm of breast: Secondary | ICD-10-CM

## 2023-09-14 MED ORDER — VALACYCLOVIR HCL 500 MG PO TABS
ORAL_TABLET | ORAL | 2 refills | Status: AC
Start: 2023-09-14 — End: ?

## 2023-09-14 NOTE — Patient Instructions (Signed)
 I value your feedback and you entrusting [place] with your care. If you get a [place] patient survey, I would appreciate you taking the time to let us know about your experience today. Thank you! ? ? ?

## 2023-09-21 ENCOUNTER — Ambulatory Visit: Payer: Self-pay | Admitting: Obstetrics and Gynecology

## 2023-09-21 DIAGNOSIS — N898 Other specified noninflammatory disorders of vagina: Secondary | ICD-10-CM

## 2023-09-21 LAB — TESTOSTERONE,FREE AND TOTAL
Testosterone, Free: 0.9 pg/mL (ref 0.0–4.2)
Testosterone: 17 ng/dL (ref 4–50)

## 2023-09-21 LAB — PROGESTERONE: Progesterone: 7.1 ng/mL

## 2023-09-21 LAB — ESTRADIOL: Estradiol: 25 pg/mL

## 2023-09-21 LAB — DHEA-SULFATE: DHEA-SO4: 126 ug/dL (ref 29.4–220.5)

## 2023-09-21 MED ORDER — ESTRADIOL 0.1 MG/GM VA CREA
TOPICAL_CREAM | VAGINAL | 1 refills | Status: AC
Start: 2023-09-21 — End: ?

## 2023-12-22 ENCOUNTER — Other Ambulatory Visit: Payer: Self-pay | Admitting: Internal Medicine

## 2023-12-22 DIAGNOSIS — F439 Reaction to severe stress, unspecified: Secondary | ICD-10-CM

## 2023-12-22 DIAGNOSIS — E7800 Pure hypercholesterolemia, unspecified: Secondary | ICD-10-CM

## 2023-12-29 ENCOUNTER — Inpatient Hospital Stay
Admission: RE | Admit: 2023-12-29 | Discharge: 2023-12-29 | Payer: Self-pay | Attending: Internal Medicine | Admitting: Internal Medicine

## 2023-12-29 DIAGNOSIS — F439 Reaction to severe stress, unspecified: Secondary | ICD-10-CM

## 2023-12-29 DIAGNOSIS — E7800 Pure hypercholesterolemia, unspecified: Secondary | ICD-10-CM
